# Patient Record
Sex: Female | Born: 1995 | Race: Black or African American | Hispanic: No | Marital: Single | Smoking: Never smoker
Health system: Southern US, Community
[De-identification: ages and names within clinical notes are randomized; demographics above are authoritative.]

## PROBLEM LIST (undated history)

## (undated) DIAGNOSIS — E039 Hypothyroidism, unspecified: Secondary | ICD-10-CM

## (undated) DIAGNOSIS — K5090 Crohn's disease, unspecified, without complications: Secondary | ICD-10-CM

## (undated) DIAGNOSIS — Z309 Encounter for contraceptive management, unspecified: Secondary | ICD-10-CM

## (undated) HISTORY — DX: Hypothyroidism, unspecified: E03.9

## (undated) HISTORY — DX: Crohn's disease, unspecified, without complications: K50.90

## (undated) HISTORY — DX: Encounter for contraceptive management, unspecified: Z30.9

---

## 2019-07-26 ENCOUNTER — Ambulatory Visit: Payer: Self-pay | Admitting: Nurse Practitioner

## 2019-08-14 ENCOUNTER — Encounter: Payer: Self-pay | Admitting: Nurse Practitioner

## 2019-08-14 ENCOUNTER — Ambulatory Visit (INDEPENDENT_AMBULATORY_CARE_PROVIDER_SITE_OTHER): Payer: Managed Care, Other (non HMO) | Admitting: Nurse Practitioner

## 2019-08-14 ENCOUNTER — Other Ambulatory Visit: Payer: Self-pay

## 2019-08-14 VITALS — BP 98/64 | HR 82 | Temp 98.1°F | Ht 65.8 in | Wt 105.4 lb

## 2019-08-14 DIAGNOSIS — R946 Abnormal results of thyroid function studies: Secondary | ICD-10-CM

## 2019-08-14 DIAGNOSIS — R61 Generalized hyperhidrosis: Secondary | ICD-10-CM

## 2019-08-14 DIAGNOSIS — E079 Disorder of thyroid, unspecified: Secondary | ICD-10-CM | POA: Diagnosis not present

## 2019-08-14 NOTE — Progress Notes (Signed)
This visit occurred during the SARS-CoV-2 public health emergency.  Safety protocols were in place, including screening questions prior to the visit, additional usage of staff PPE, and extensive cleaning of exam room while observing appropriate contact time as indicated for disinfecting solutions.  Subjective:     Patient ID: Gina Ware , female    DOB: 04/06/1996 , 24 y.o.   MRN: [id number]   Chief Complaint  Patient presents with  . Establish Care  . Thyroid problems    HPI  Here to establish care - she was going to a primary care in [place] - she is unable to remember who she was seeing. She moved here for one year for new opportunities. She works as a Engineer, maintenance (IT).  Single. No children.  Her mother found this office for her. She went to her gynecologist and there were concerns she had a problem with her thyroid based off her urine.  She went to get her birth control - she has nexplanon.    PMH - none  [place] - mother - none, father - healthy.    She gets hot at night and will sweat.     No past medical history on file.   Family History  Problem Relation Age of Onset  . Healthy Mother   . Healthy Father   . Diabetes Paternal Grandfather   . Hypertension Paternal Grandfather      Current Outpatient Medications:  .  etonogestrel (NEXPLANON) 21 MG IMPL implant, 1 each by Subdermal route once., Disp: , Rfl:    No Known Allergies   Review of Systems  Constitutional: Negative.  Negative for fatigue.  Respiratory: Negative.   Cardiovascular: Negative.  Negative for chest pain, palpitations and leg swelling.  Endocrine: Negative for cold intolerance, heat intolerance, polydipsia, polyphagia and polyuria.       Will feel hot at night alot  Musculoskeletal: Negative.   Skin: Negative.   Neurological: Negative for dizziness and headaches.  Psychiatric/Behavioral: Negative.      Today's Vitals   08/14/19 1200  BP: 98/64  Pulse: 82  Temp: 98.1 F (36.7 C)   TempSrc: Oral  Weight: 105 lb 6.4 oz (47.8 kg)  Height: 5' 5.8" (1.671 m)   Body mass index is 17.12 kg/m.   Objective:  Physical Exam Vitals reviewed.  Constitutional:      Appearance: Normal appearance.  Neck:     Comments: Neck around thyroid appears full Cardiovascular:     Rate and Rhythm: Normal rate and regular rhythm.     Pulses: Normal pulses.     Heart sounds: Normal heart sounds. No murmur.  Skin:    Capillary Refill: Capillary refill takes less than 2 seconds.  Neurological:     General: No focal deficit present.     Mental Status: She is alert and oriented to person, place, and time.     Cranial Nerves: No cranial nerve deficit.  Psychiatric:        Mood and Affect: Mood normal.        Behavior: Behavior normal.        Thought Content: Thought content normal.        Judgment: Judgment normal.         Assessment And Plan:     1. Night sweats  Will check for metabolic causes - TSH - CBC no Diff - BMP8+eGFR - QuantiFERON-TB Gold Plus - Ambulatory referral to Endocrinology  2. Swelling of thyroid gland  Neck is full  Will check  thyroid studies   Pending results will refer to endocrinology  Will order thyroid ultrasound as well - US THYROID; Future - Ambulatory referral to Endocrinology  3. Abnormal results of thyroid function studies  Levels are overproducing will refer to endocrinology - Ambulatory referral to Endocrinology  Minette Brine, [name]    THE PATIENT IS ENCOURAGED TO PRACTICE SOCIAL DISTANCING DUE TO THE COVID-19 PANDEMIC.

## 2019-08-16 LAB — BMP8+EGFR
BUN/Creatinine Ratio: 14 (ref 9–23)
BUN: 9 mg/dL (ref 6–20)
CO2: 21 mmol/L (ref 20–29)
Calcium: 8.7 mg/dL (ref 8.7–10.2)
Chloride: 110 mmol/L — ABNORMAL HIGH (ref 96–106)
Creatinine, Ser: 0.65 mg/dL (ref 0.57–1.00)
GFR calc Af Amer: 147 mL/min/{1.73_m2} (ref >59)
GFR calc non Af Amer: 127 mL/min/{1.73_m2} (ref >59)
Glucose: 74 mg/dL (ref 65–99)
Potassium: 4 mmol/L (ref 3.5–5.2)
Sodium: 142 mmol/L (ref 134–144)

## 2019-08-16 LAB — QUANTIFERON-TB GOLD PLUS
QuantiFERON Mitogen Value: >10 IU/mL
QuantiFERON Nil Value: 0.06 IU/mL
QuantiFERON TB1 Ag Value: 0.16 IU/mL
QuantiFERON TB2 Ag Value: 0.1 IU/mL
QuantiFERON-TB Gold Plus: NEGATIVE

## 2019-08-16 LAB — CBC
Hematocrit: 36.6 % (ref 34.0–46.6)
Hemoglobin: 12.2 g/dL (ref 11.1–15.9)
MCH: 31 pg (ref 26.6–33.0)
MCHC: 33.3 g/dL (ref 31.5–35.7)
MCV: 93 fL (ref 79–97)
Platelets: 245 10*3/uL (ref 150–450)
RBC: 3.94 x10E6/uL (ref 3.77–5.28)
RDW: 11.7 % (ref 11.7–15.4)
WBC: 5.6 10*3/uL (ref 3.4–10.8)

## 2019-08-16 LAB — TSH: TSH: 0.36 u[IU]/mL — ABNORMAL LOW (ref 0.450–4.500)

## 2019-09-25 ENCOUNTER — Other Ambulatory Visit: Payer: Managed Care, Other (non HMO)

## 2019-09-26 ENCOUNTER — Other Ambulatory Visit: Payer: Self-pay

## 2019-09-28 ENCOUNTER — Ambulatory Visit (INDEPENDENT_AMBULATORY_CARE_PROVIDER_SITE_OTHER): Payer: 59 | Admitting: Endocrinology

## 2019-09-28 ENCOUNTER — Ambulatory Visit
Admission: RE | Admit: 2019-09-28 | Discharge: 2019-09-28 | Disposition: A | Discharge status: Home / Self Care | Payer: 59 | Source: Ambulatory Visit | Attending: Nurse Practitioner | Admitting: Nurse Practitioner

## 2019-09-28 ENCOUNTER — Other Ambulatory Visit: Payer: Self-pay

## 2019-09-28 ENCOUNTER — Encounter: Payer: Self-pay | Admitting: Endocrinology

## 2019-09-28 DIAGNOSIS — E0590 Thyrotoxicosis, unspecified without thyrotoxic crisis or storm: Secondary | ICD-10-CM | POA: Insufficient documentation

## 2019-09-28 DIAGNOSIS — E079 Disorder of thyroid, unspecified: Secondary | ICD-10-CM

## 2019-09-28 LAB — T4, FREE: Free T4: 0.74 ng/dL (ref 0.60–1.60)

## 2019-09-28 LAB — TSH: TSH: 0.19 u[IU]/mL — ABNORMAL LOW (ref 0.35–4.50)

## 2019-09-28 NOTE — Patient Instructions (Addendum)
Let's recheck the blood test.  We'll let you know about the results of this, and the ultrasound.  Even if today's blood test is normal, we should still keep an eye on this, as it can become abnormal again Please come back for a follow-up appointment in 2-3 months.

## 2019-09-28 NOTE — Progress Notes (Signed)
Subjective:    Patient ID: Gina Ware, female    DOB: Jul 01, 1995, 24 y.o.   MRN: [id number]  HPI Pt is referred by Arnette Felts, NP, for hyperthyroidism.  Pt reports she was dx'ed with hyperthyroidism in early 2021.  she has never been on therapy for this.  she has never had XRT to the anterior neck, or thyroid surgery.  she does not consume kelp or any other non-prescribed thyroid medication.  she has never been on amiodarone.  She has night sweats.  No past medical history on file.  No past surgical history on file.  Social History   Socioeconomic History  . Marital status: Single    Spouse name: Not on file  . Number of children: Not on file  . Years of education: Not on file  . Highest education level: Not on file  Occupational History  . Not on file  Tobacco Use  . Smoking status: Never Smoker  . Smokeless tobacco: Never Used  Substance and Sexual Activity  . Alcohol use: Never  . Drug use: Never  . Sexual activity: Not on file  Other Topics Concern  . Not on file  Social History Narrative  . Not on file   Social Determinants of Health   Financial Resource Strain:   . Difficulty of Paying Living Expenses:   Food Insecurity:   . Worried About Programme researcher, broadcasting/film/video in the Last Year:   . Barista in the Last Year:   Transportation Needs:   . Freight forwarder (Medical):   Marland Kitchen Lack of Transportation (Non-Medical):   Physical Activity:   . Days of Exercise per Week:   . Minutes of Exercise per Session:   Stress:   . Feeling of Stress :   Social Connections:   . Frequency of Communication with Friends and Family:   . Frequency of Social Gatherings with Friends and Family:   . Attends Religious Services:   . Active Member of Clubs or Organizations:   . Attends Banker Meetings:   Marland Kitchen Marital Status:   Intimate Partner Violence:   . Fear of Current or Ex-Partner:   . Emotionally Abused:   Marland Kitchen Physically Abused:   . Sexually Abused:      Current Outpatient Medications on File Prior to Visit  Medication Sig Dispense Refill  . etonogestrel (NEXPLANON) 68 MG IMPL implant 1 each by Subdermal route once.     No current facility-administered medications on file prior to visit.    No Known Allergies  Family History  Problem Relation Age of Onset  . Healthy Mother   . Healthy Father   . Diabetes Paternal Grandfather   . Hypertension Paternal Grandfather   . Thyroid disease Neg Hx     BP 110/70   Pulse 74   Ht 5\' 5"  (1.651 m)   Wt 104 lb (47.2 kg)   SpO2 99%   BMI 17.31 kg/m   Review of Systems denies weight loss, palpitations, sob, muscle weakness, tremor, and heat intolerance.  She has anxiety.     Objective:   Physical Exam VS: see vs page GEN: no distress HEAD: head: no deformity eyes: there is bilat proptosis external nose and ears are normal NECK: supple, thyroid is not enlarged CHEST WALL: no deformity LUNGS: clear to auscultation CV: reg rate and rhythm, no murmur.  MUSCULOSKELETAL: muscle bulk and strength are grossly normal.  no obvious joint swelling.  gait is normal and steady.  EXTEMITIES: no deformity.  no edema PULSES: no carotid bruit NEURO:  cn 2-12 grossly intact.   readily moves all 4's.  sensation is intact to touch on all 4's SKIN:  Normal texture and temperature.  No rash or suspicious lesion is visible.   NODES:  None palpable at the neck PSYCH: alert, well-oriented.  Does not appear anxious nor depressed.    Lab Results  Component Value Date   CREATININE 0.65 08/14/2019   BUN 9 08/14/2019   NA 142 08/14/2019   K 4.0 08/14/2019   CL 110 (H) 08/14/2019   CO2 21 08/14/2019   Lab Results  Component Value Date   TSH 0.19 (L) 09/28/2019   [place]: Mildly heterogeneous and slightly atrophic thyroid gland without discrete nodule or mass. Findings are nonspecific though could be seen in the setting of thyroiditis.  I have reviewed outside records, and summarized: Pt was noted to  have low TSH, and referred here.  She was seen as a new pt.  Main symptom was night sweats.        Assessment & Plan:  Hyperthyroidism, new Proptosis.  This suggests Grave's Dz as a cause of the above.    Patient Instructions  Let's recheck the blood test.  We'll let you know about the results of this, and the ultrasound.  Even if today's blood test is normal, we should still keep an eye on this, as it can become abnormal again Please come back for a follow-up appointment in 2-3 months.

## 2019-10-08 ENCOUNTER — Other Ambulatory Visit: Payer: Self-pay

## 2019-10-08 ENCOUNTER — Encounter: Payer: Self-pay | Admitting: Nurse Practitioner

## 2019-10-08 ENCOUNTER — Ambulatory Visit: Payer: 59 | Admitting: Nurse Practitioner

## 2019-10-08 VITALS — BP 110/78 | HR 82 | Temp 98.2°F | Ht 65.4 in | Wt 103.2 lb

## 2019-10-08 DIAGNOSIS — R61 Generalized hyperhidrosis: Secondary | ICD-10-CM | POA: Diagnosis not present

## 2019-10-08 DIAGNOSIS — E0590 Thyrotoxicosis, unspecified without thyrotoxic crisis or storm: Secondary | ICD-10-CM

## 2019-10-08 NOTE — Patient Instructions (Signed)

## 2019-10-08 NOTE — Progress Notes (Signed)
  This visit occurred during the SARS-CoV-2 public health emergency.  Safety protocols were in place, including screening questions prior to the visit, additional usage of staff PPE, and extensive cleaning of exam room while observing appropriate contact time as indicated for disinfecting solutions.  Subjective:     Patient ID: Gina Ware , female    DOB: 02-01-1996 , 24 y.o.   MRN: [id number]   Chief Complaint  Patient presents with  . Night Sweats    patient stated has not been having sweats as often as she was     HPI  Her hot flashes are not as consistent.  Last night she had feelings. She has seen Dr. Everardo All who has diagnosed her with mild Graves Disease.  She is to follow up with him in 3 months but she is now wanting medications.    She is going to [facility] for her PAP she is scheduled for Wednesday.  Thyroid Problem Presents for follow-up visit. Patient reports no palpitations, weight gain or weight loss. (Night sweats) The symptoms have been improving.     No past medical history on file.   Family History  Problem Relation Age of Onset  . Healthy Mother   . Healthy Father   . Diabetes Paternal Grandfather   . Hypertension Paternal Grandfather   . Thyroid disease Neg Hx      Current Outpatient Medications:  .  etonogestrel (NEXPLANON) 68 MG IMPL implant, 1 each by Subdermal route once., Disp: , Rfl:    No Known Allergies   Review of Systems  Constitutional: Negative.  Negative for fever, weight gain and weight loss.  Respiratory: Negative.   Cardiovascular: Negative.  Negative for chest pain, palpitations and leg swelling.  Endocrine: Negative for polydipsia, polyphagia and polyuria.  Neurological: Negative for dizziness and headaches.  Psychiatric/Behavioral: Negative.      Today's Vitals   10/08/19 1410  BP: 110/78  Pulse: 82  Temp: 98.2 F (36.8 C)  TempSrc: Oral  Weight: 103 lb 3.2 oz (46.8 kg)  Height: 5' 5.4" (1.661 m)  PainSc: 0-No pain    Body mass index is 16.96 kg/m.   Objective:  Physical Exam Vitals reviewed.  Constitutional:      Appearance: Normal appearance.  Cardiovascular:     Rate and Rhythm: Normal rate and regular rhythm.     Pulses: Normal pulses.     Heart sounds: Normal heart sounds. No murmur.  Pulmonary:     Effort: Pulmonary effort is normal. No respiratory distress.     Breath sounds: Normal breath sounds.  Skin:    Capillary Refill: Capillary refill takes less than 2 seconds.  Neurological:     General: No focal deficit present.     Mental Status: She is alert and oriented to person, place, and time.  Psychiatric:        Mood and Affect: Mood normal.        Behavior: Behavior normal.        Thought Content: Thought content normal.        Judgment: Judgment normal.         Assessment And Plan:     1. Hyperthyroidism  She has been seen by Dr. Everardo All, I have advised her to call Dr. Everardo All to start medications.  2. Night sweats  Has slightly improved   Arnette Felts, [name]    THE PATIENT IS ENCOURAGED TO PRACTICE SOCIAL DISTANCING DUE TO THE COVID-19 PANDEMIC.

## 2019-10-09 ENCOUNTER — Ambulatory Visit: Payer: Managed Care, Other (non HMO) | Admitting: Nurse Practitioner

## 2019-10-09 ENCOUNTER — Encounter: Payer: Self-pay | Admitting: Nurse Practitioner

## 2019-10-30 ENCOUNTER — Encounter: Payer: Self-pay | Admitting: Endocrinology

## 2019-10-30 ENCOUNTER — Other Ambulatory Visit: Payer: Self-pay

## 2019-10-30 ENCOUNTER — Ambulatory Visit (INDEPENDENT_AMBULATORY_CARE_PROVIDER_SITE_OTHER): Payer: 59 | Admitting: Endocrinology

## 2019-10-30 VITALS — BP 98/70 | HR 76 | Ht 65.4 in | Wt 103.0 lb

## 2019-10-30 DIAGNOSIS — Z309 Encounter for contraceptive management, unspecified: Secondary | ICD-10-CM | POA: Insufficient documentation

## 2019-10-30 DIAGNOSIS — Z3040 Encounter for surveillance of contraceptives, unspecified: Secondary | ICD-10-CM | POA: Diagnosis not present

## 2019-10-30 DIAGNOSIS — E0590 Thyrotoxicosis, unspecified without thyrotoxic crisis or storm: Secondary | ICD-10-CM | POA: Diagnosis not present

## 2019-10-30 NOTE — Progress Notes (Signed)
   Subjective:    Patient ID: Gina Ware, female    DOB: 1996-06-11, 24 y.o.   MRN: [id number]  HPI  Pt returns for f/u of mild hyperthyroidism (dx'ed early 2021; she has never been on therapy for this; [place] is c/w thyroiditis).  pt states she feels well in general.   Past Medical History:  Diagnosis Date  . Contraception management     No past surgical history on file.  Social History   Socioeconomic History  . Marital status: Single    Spouse name: Not on file  . Number of children: Not on file  . Years of education: Not on file  . Highest education level: Not on file  Occupational History  . Not on file  Tobacco Use  . Smoking status: Never Smoker  . Smokeless tobacco: Never Used  Substance and Sexual Activity  . Alcohol use: Never  . Drug use: Never  . Sexual activity: Not on file  Other Topics Concern  . Not on file  Social History Narrative  . Not on file   Social Determinants of Health   Financial Resource Strain:   . Difficulty of Paying Living Expenses:   Food Insecurity:   . Worried About Programme researcher, broadcasting/film/video in the Last Year:   . Barista in the Last Year:   Transportation Needs:   . Freight forwarder (Medical):   Marland Kitchen Lack of Transportation (Non-Medical):   Physical Activity:   . Days of Exercise per Week:   . Minutes of Exercise per Session:   Stress:   . Feeling of Stress :   Social Connections:   . Frequency of Communication with Friends and Family:   . Frequency of Social Gatherings with Friends and Family:   . Attends Religious Services:   . Active Member of Clubs or Organizations:   . Attends Banker Meetings:   Marland Kitchen Marital Status:   Intimate Partner Violence:   . Fear of Current or Ex-Partner:   . Emotionally Abused:   Marland Kitchen Physically Abused:   . Sexually Abused:     Current Outpatient Medications on File Prior to Visit  Medication Sig Dispense Refill  . etonogestrel (NEXPLANON) 68 MG IMPL implant 1 each by  Subdermal route once.     No current facility-administered medications on file prior to visit.    No Known Allergies  Family History  Problem Relation Age of Onset  . Healthy Mother   . Healthy Father   . Diabetes Paternal Grandfather   . Hypertension Paternal Grandfather   . Thyroid disease Neg Hx     BP 98/70   Pulse 76   Ht 5' 5.4" (1.661 m)   Wt 103 lb (46.7 kg)   SpO2 98%   BMI 16.93 kg/m    Review of Systems Denies neck pain or swelling.     Objective:   Physical Exam VITAL SIGNS:  See vs page GENERAL: no distress NECK: thyroid is slightly enlarged--diffuse.  Lab Results  Component Value Date   TSH 0.28 (L) 10/30/2019       Assessment & Plan:  Hyperrthyroidism, mild Thyroiditis, per [place]: I told pt this is c/w mild Grave's Dz No medication is needed.  Please come back for a follow-up appointment in 4-6 months.

## 2019-10-30 NOTE — Patient Instructions (Addendum)
Blood tests are requested for you today.  We'll let you know about the results.   Even if today's blood test is normal, we should still keep an eye on this, as it can become abnormal again.  If it is more abnormal., you should take medication for it.

## 2019-10-31 ENCOUNTER — Encounter: Payer: Self-pay | Admitting: Endocrinology

## 2019-10-31 LAB — T4, FREE: Free T4: 0.98 ng/dL (ref 0.60–1.60)

## 2019-10-31 LAB — TSH: TSH: 0.28 u[IU]/mL — ABNORMAL LOW (ref 0.35–4.50)

## 2019-11-01 ENCOUNTER — Other Ambulatory Visit: Payer: Self-pay | Admitting: Endocrinology

## 2019-11-01 MED ORDER — METHIMAZOLE 5 MG PO TABS: 5.0000 mg | ORAL_TABLET | Freq: Three times a day (TID) | ORAL | 1 refills | Status: DC

## 2019-11-03 ENCOUNTER — Inpatient Hospital Stay
Admission: RE | Admit: 2019-11-03 | Discharge: 2019-11-03 | Disposition: A | Discharge status: Home / Self Care | Payer: 59 | Source: Ambulatory Visit

## 2019-11-03 ENCOUNTER — Ambulatory Visit (INDEPENDENT_AMBULATORY_CARE_PROVIDER_SITE_OTHER)
Admission: RE | Admit: 2019-11-03 | Discharge: 2019-11-03 | Disposition: A | Discharge status: Home / Self Care | Payer: 59 | Source: Ambulatory Visit

## 2019-11-03 DIAGNOSIS — J029 Acute pharyngitis, unspecified: Secondary | ICD-10-CM | POA: Diagnosis not present

## 2019-11-03 NOTE — ED Provider Notes (Signed)
Virtual Visit via Video Note:  Gina Ware  initiated request for Telemedicine visit with [facility] team. I connected with Gina Ware  on 11/03/2019 at 1245 PM  for a synchronized telemedicine visit using a video enabled HIPPA compliant telemedicine application. I verified that I am speaking with Gina Ware  using two identifiers. Durward Parcel, [name]  was physically located in a [facility] Urgent care site and Gina Ware was located at a different location.   The limitations of evaluation and management by telemedicine as well as the availability of in-person appointments were discussed. Patient was informed that she  may incur a bill ( including co-pay) for this virtual visit encounter. Gina Ware  expressed understanding and gave verbal consent to proceed with virtual visit.     History of Present Illness:Gina Ware  is a 24 y.o. female with history of hypothyroidism was recently diagnosed presented via telehealth with a complaint of sore and swollen throat.  Reports some difficulty swallowing.  Denies any exposure to flu, strep or Covid.  Patient reports  she is not currently taking any medication for hypothyroidism as PCP recommended no medication at this time.  Denies chills, fever, nausea vomiting, diarrhea.  Past Medical History:  Diagnosis Date  . Contraception management     No Known Allergies      Observations/Objective: VITALS: Per patient if applicable, see vitals. GENERAL: Alert, appears well and in no acute distress. CARDIOPULMONARY: No increased WOB. Speaking in clear sentences. I:E ratio WNL.  PSYCH: Pleasant and cooperative, well-groomed. Speech normal rate and rhythm. Affect is appropriate. Insight and judgement are appropriate. Attention is focused, linear, and appropriate.  NEURO: Oriented as arrived to appointment on time with no prompting. Moves both UE equally.      Assessment and Plan:     ICD-10-CM   1. Sore throat   J02.9     Follow Up Instructions: Was advised to go to the closest urgent care for further evaluation   I discussed the assessment and treatment plan with the patient. The patient was provided an opportunity to ask questions and all were answered. The patient agreed with the plan and demonstrated an understanding of the instructions.   The patient was advised to call back or seek an in-person evaluation if the symptoms worsen or if the condition fails to improve as anticipated.  I provided 15 minutes of non-face-to-face time during this encounter.    Durward Parcel, [name]  11/03/2019 12:56 PM         Durward Parcel, [name] 11/03/19 1257

## 2019-11-03 NOTE — Discharge Instructions (Addendum)
Patient was advised to go to the nearest urgent care for further evaluation

## 2019-12-31 ENCOUNTER — Ambulatory Visit: Payer: 59 | Admitting: Endocrinology

## 2019-12-31 DIAGNOSIS — Z0289 Encounter for other administrative examinations: Secondary | ICD-10-CM

## 2020-01-14 ENCOUNTER — Ambulatory Visit: Payer: 59 | Admitting: Family Medicine

## 2020-01-14 NOTE — Progress Notes (Deleted)
   Subjective:    Patient ID: Gina Ware, female    DOB: 1996-01-20, 24 y.o.   MRN: [id number]   CC: Establish care  HPI:  Gina Ware is a very pleasant 24 y.o. female who presents today to establish care.  Initial concerns:***  Past medical history: Hyperthyroidism***.  Past surgical history:***  Current medications:***  Family history:***  Social history:***  ROS: pertinent noted in the HPI   Objective:  There were no vitals taken for this visit.  Vitals and nursing note reviewed  General: NAD, pleasant, able to participate in exam Cardiac: RRR, S1 S2 present. normal heart sounds, no murmurs. Respiratory: CTAB, normal effort, No wheezes, rales or rhonchi Abdomen: Bowel sounds present, non-tender, non-distended, no hepatosplenomegaly Extremities: no edema or cyanosis. Skin: warm and dry, no rashes noted Neuro: alert, no obvious focal deficits Psych: Normal affect and mood   Assessment & Plan:    No problem-specific Assessment & Plan notes found for this encounter.  Hep C screening, HIV screening, Tdap  Jackelyn Poling, DO [facility] PGY-1

## 2020-04-08 ENCOUNTER — Encounter: Payer: 59 | Admitting: Nurse Practitioner

## 2020-12-17 ENCOUNTER — Other Ambulatory Visit: Payer: Self-pay

## 2020-12-17 ENCOUNTER — Encounter: Payer: Self-pay | Admitting: Nurse Practitioner

## 2020-12-17 ENCOUNTER — Telehealth (INDEPENDENT_AMBULATORY_CARE_PROVIDER_SITE_OTHER): Payer: Self-pay | Admitting: Nurse Practitioner

## 2020-12-17 ENCOUNTER — Other Ambulatory Visit: Payer: Self-pay | Admitting: Nurse Practitioner

## 2020-12-17 VITALS — Ht 64.0 in | Wt 110.0 lb

## 2020-12-17 DIAGNOSIS — J039 Acute tonsillitis, unspecified: Secondary | ICD-10-CM

## 2020-12-17 DIAGNOSIS — U071 COVID-19: Secondary | ICD-10-CM

## 2020-12-17 DIAGNOSIS — J351 Hypertrophy of tonsils: Secondary | ICD-10-CM

## 2020-12-17 LAB — POCT RAPID STREP A (OFFICE): Rapid Strep A Screen: NEGATIVE

## 2020-12-17 MED ORDER — AMOXICILLIN 875 MG PO TABS: 875.0000 mg | ORAL_TABLET | Freq: Two times a day (BID) | ORAL | 0 refills | Status: DC

## 2020-12-17 NOTE — Patient Instructions (Signed)
COVID-19: Quarantine and Isolation Quarantine If you were exposed Quarantine and stay away from others when you have been in close contact with someone whohas COVID-19. Isolate If you are sick or test positive Isolate when you are sick or when you have COVID-19, even if you don't have symptoms. When to stay home Calculating quarantine The date of your exposure is considered day 0. Day 1 is the first full day after your last contact with a person who has had COVID-19. Stay home and away from other people for at least 5 days. Learn why CDC updated guidance for the general public. IF YOU were exposed to COVID-19 and are NOT up-to-date IF YOU were exposed to COVID-19 and are NOT on COVID-19 vaccinations Quarantine for at least 5 days Stay home Stay home and quarantine for at least 5 full days. Wear a well-fitted mask if you must be around others in your home. Do not travel. Get tested Even if you don't develop symptoms, get tested at least 5 days after you last had close contact with someone with COVID-19. After quarantine Watch for symptoms Watch for symptoms until 10 days after you last had close contact with someone with COVID-19. Avoid travel It is best to avoid travel until a full 10 days after you last had close contact with someone with COVID-19. If you develop symptoms Isolate immediately and get tested. Continue to stay home until you know the results. Wear a well-fitted mask around others. Take precautions until day 10 Wear a mask Wear a well-fitted mask for 10 full days any time you are around others inside your home or in public. Do not go to places where you are unable to wear a mask. If you must travel during days 6-10, take precautions. Avoid being around people who are at high risk IF YOU were exposed to COVID-19 and are up-to-date IF YOU were exposed to COVID-19 and are on COVID-19 vaccinations No quarantine You do not need to stay home unless you develop  symptoms. Get tested Even if you don't develop symptoms, get tested at least 5 days after you last had close contact with someone with COVID-19. Watch for symptoms Watch for symptoms until 10 days after you last had close contact with someone with COVID-19. If you develop symptoms Isolate immediately and get tested. Continue to stay home until you know the results. Wear a well-fitted mask around others. Take precautions until day 10 Wear a mask Wear a well-fitted mask for 10 full days any time you are around others inside your home or in public. Do not go to places where you are unable to wear a mask. Take precautions if traveling Avoid being around people who are at high risk IF YOU were exposed to COVID-19 and had confirmed COVID-19 within the past 90 days (you tested positive using a viral test) No quarantine You do not need to stay home unless you develop symptoms. Watch for symptoms Watch for symptoms until 10 days after you last had close contact with someone with COVID-19. If you develop symptoms Isolate immediately and get tested. Continue to stay home until you know the results. Wear a well-fitted mask around others. Take precautions until day 10 Wear a mask Wear a well-fitted mask for 10 full days any time you are around others inside your home or in public. Do not go to places where you are unable to wear a mask. Take precautions if traveling Avoid being around people who are at high risk Calculating isolation   Day 0 is your first day of symptoms or a positive viral test. Day 1 is the first full day after your symptoms developed or your test specimen was collected. If you have COVID-19 or have symptoms, isolate for at least 5 days. IF YOU tested positive for COVID-19 or have symptoms, regardless of vaccination status Stay home for at least 5 days Stay home for 5 days and isolate from others in your home. Wear a well-fitted mask if you must be around others in your home. Do not  travel. Ending isolation if you had symptoms End isolation after 5 full days if you are fever-free for 24 hours (without the use of fever-reducing medication) and your symptoms are improving. Ending isolation if you did NOT have symptoms End isolation after at least 5 full days after your positive test. If you were severely ill with COVID-19 or are immunocompromised You should isolate for at least 10 days. Consult your doctor before ending isolation. Take precautions until day 10 Wear a mask Wear a well-fitted mask for 10 full days any time you are around others inside your home or in public. Do not go to places where you are unable to wear a mask. Do not travel Do not travel until a full 10 days after your symptoms started or the date your positive test was taken if you had no symptoms. Avoid being around people who are at high risk Definitions Exposure Contact with someone infected with SARS-CoV-2, the virus that causes COVID-19,in a way that increases the likelihood of getting infected with the virus. Close contact A close contact is someone who was less than 6 feet away from an infected person (laboratory-confirmed or a clinical diagnosis) for a cumulative total of 15 minutes or more over a 24-hour period. For example, three individual 5-minute exposures for a total of 15 minutes. People who are exposed to someone with COVID-19 after they completed at least 5 days of isolation are notconsidered close contacts. Quarantine Quarantine is a strategy used to prevent transmission of COVID-19 by keeping people who have been in close contact with someone with COVID-19 apart from others. Who does not need to quarantine? If you had close contact with someone with COVID-19 and you are in one of the following groups, you do not need to quarantine. You are up to date with your COVID-19 vaccines. You had confirmed COVID-19 within the last 90 days (meaning you tested positive using a viral test). You  should wear a well-fitting mask around others for 10 days from the date of your last close contact with someone with COVID-19 (the date of last close contact is considered day 0). Get tested at least 5 days after you last had close contact with someone with COVID-19. If you test positive or develop COVID-19 symptoms, isolate from other people and follow recommendations in the Isolation section below. If you tested positive for COVID-19 with a viral test within the previous 90 days and subsequently recovered and remain without COVID-19 symptoms, you do not need to quarantine or get tested after close contact. You should wear a well-fitting mask around others for 10 days from the date of your last close contact withsomeone with COVID-19 (the date of last close contact is considered day 0). Who should quarantine? If you come into close contact with someone with COVID-19, you should quarantine if you are not up to date on COVID-19 vaccines. This includes people who are not vaccinated. What to do for quarantine Stay home and away   from other people for at least 5 days (day 0 through day 5) after your last contact with a person who has COVID-19. The date of your exposure is considered day 0. Wear a well-fitting mask when around others at home, if possible. For 10 days after your last close contact with someone with COVID-19, watch for fever (100.4F or greater), cough, shortness of breath, or other COVID-19 symptoms. If you develop symptoms, get tested immediately and isolate until you receive your test results. If you test positive, follow isolation recommendations. If you do not develop symptoms, get tested at least 5 days after you last had close contact with someone with COVID-19. If you test negative, you can leave your home, but continue to wear a well-fitting mask when around others at home and in public until 10 days after your last close contact with someone with COVID-19. If you test positive, you should  isolate for at least 5 days from the date of your positive test (if you do not have symptoms). If you do develop COVID-19 symptoms, isolate for at least 5 days from the date your symptoms began (the date the symptoms started is day 0). Follow recommendations in the isolation section below. If you are unable to get a test 5 days after last close contact with someone with COVID-19, you can leave your home after day 5 if you have been without COVID-19 symptoms throughout the 5-day period. Wear a well-fitting mask for 10 days after your date of last close contact when around others at home and in public. Avoid people who are immunocompromised or at high risk for severe disease, and nursing homes and other high-risk settings, until after at least 10 days. If possible, stay away from people you live with, especially people who are at higher risk for getting very sick from COVID-19, as well as others outside your home throughout the full 10 days after your last close contact with someone with COVID-19. If you are unable to quarantine, you should wear a well-fitting mask for 10 days when around others at home and in public. If you are unable to wear a mask when around others, you should continue to quarantine for 10 days. Avoid people who are immunocompromised or at high risk for severe disease, and nursing homes and other high-risk settings, until after at least 10 days. See additional information about travel. Do not go to places where you are unable to wear a mask, such as restaurants and some gyms, and avoid eating around others at home and at work until after 10 days after your last close contact with someone with COVID-19. After quarantine Watch for symptoms until 10 days after your last close contact with someone with COVID-19. If you have symptoms, isolate immediately and get tested. Quarantine in high-risk congregate settings In certain congregate settings that have high risk of secondary transmission  (such as correctional and detention facilities, homeless shelters, or cruise ships), CDC recommends a 10-day quarantine for residents, regardless of vaccination and booster status. During periods of critical staffing shortages, facilities may consider shortening the quarantine period for staff to ensure continuity of operations. Decisions to shorten quarantine in these settings should be made in consultation with state, local, tribal, or territorial health departments and should take into consideration the context and characteristics of the facility. CDC's setting-specific guidance provides additional recommendations for these settings. Isolation Isolation is used to separate people with confirmed or suspected COVID-19 from those without COVID-19. People who are in isolation should stay   home until it's safe for them to be around others. At home, anyone sick or infected should separate from others, or wear a well-fitting mask when they need to be around others. People in isolation should stay in a specific "sick room" or area and use a separate bathroom if available. Everyone who has presumed or confirmed COVID-19 should stay home and isolate from other people for at least 5 full days (day 0 is the first day of symptoms or the date of the day of the positive viral test for asymptomatic persons). They should wear a mask when around others at home and in public for an additional 5 days. People who are confirmed to have COVID-19 or are showing symptoms of COVID-19 need to isolate regardless of their vaccination status. This includes: People who have a positive viral test for COVID-19, regardless of whether or not they have symptoms. People with symptoms of COVID-19, including people who are awaiting test results or have not been tested. People with symptoms should isolate even if they do not know if they have been in close contact with someone with COVID-19. What to do for isolation Monitor your symptoms. If you  have an emergency warning sign (including trouble breathing), seek emergency medical care immediately. Stay in a separate room from other household members, if possible. Use a separate bathroom, if possible. Take steps to improve ventilation at home, if possible. Avoid contact with other members of the household and pets. Don't share personal household items, like cups, towels, and utensils. Wear a well-fitting mask when you need to be around other people. Learn more about what to do if you are sick and how to notify your contacts. Ending isolation for people who had COVID-19 and had symptoms If you had COVID-19 and had symptoms, isolate for at least 5 days. To calculate your 5-day isolation period, day 0 is your first day of symptoms. Day 1 is the first full day after your symptoms developed. You can leave isolation after 5 full days. You can end isolation after 5 full days if you are fever-free for 24 hours without the use of fever-reducing medication and your other symptoms have improved (Loss of taste and smell may persist for weeks or months after recovery and need not delay the end of isolation). You should continue to wear a well-fitting mask around others at home and in public for 5 additional days (day 6 through day 10) after the end of your 5-day isolation period. If you are unable to wear a mask when around others, you should continue to isolate for a full 10 days. Avoid people who are immunocompromised or at high risk for severe disease, and nursing homes and other high-risk settings, until after at least 10 days. If you continue to have fever or your other symptoms have not improved after 5 days of isolation, you should wait to end your isolation until you are fever-free for 24 hours without the use of fever-reducing medication and your other symptoms have improved. Continue to wear a well-fitting mask. Contact your healthcare provider if you have questions. See additional information about  travel. Do not go to places where you are unable to wear a mask, such as restaurants and some gyms, and avoid eating around others at home and at work until a full 10 days after your first day of symptoms. If an individual has access to a test and wants to test, the best approach is to use an antigen test1 towards the end of   the 5-day isolation period. Collect the test sample only if you are fever-free for 24 hours without the use of fever-reducing medication and your other symptoms have improved (loss of taste and smell may persist for weeks or months after recovery and need not delay the end of isolation). If your test result is positive, you should continue to isolate until day 10. If your test result is negative, you can end isolation, but continue to wear a well-fitting mask around others at home and in public until day 10. Follow additional recommendations for masking and avoiding travel as described above. 1As noted in the labeling for authorized over-the counter antigen tests: Negative results should be treated as presumptive. Negative results do not rule out SARS-CoV-2 infection and should not be used as the sole basis for treatment or patient management decisions, including infection control decisions. To improve results, antigen tests should be used twice over a three-day period with at least 24 hours and no more than 48 hours between tests. Note that these recommendations on ending isolation do not apply to people with moderate or severe COVID-19 or with weakened immune systems (immunocompromised). See section below for recommendations for when toend isolation for these groups. Ending isolation for people who tested positive for COVID-19 but had no symptoms If you test positive for COVID-19 and never develop symptoms, isolate for at least 5 days. Day 0 is the day of your positive viral test (based on the date you were tested) and day 1 is the first full day after the specimen was collected for your  positive test. You can leave isolation after 5 full days. If you continue to have no symptoms, you can end isolation after at least 5 days. You should continue to wear a well-fitting mask around others at home and in public until day 10 (day 6 through day 10). If you are unable to wear a mask when around others, you should continue to isolate for 10 days. Avoid people who are immunocompromised or at high risk for severe disease, and nursing homes and other high-risk settings, until after at least 10 days. If you develop symptoms after testing positive, your 5-day isolation period should start over. Day 0 is your first day of symptoms. Follow the recommendations above for ending isolation for people who had COVID-19 and had symptoms. See additional information about travel. Do not go to places where you are unable to wear a mask, such as restaurants and some gyms, and avoid eating around others at home and at work until 10 days after the day of your positive test. If an individual has access to a test and wants to test, the best approach is to use an antigen test1 towards the end of the 5-day isolation period. If your test result is positive, you should continue to isolate until day 10. If your test result is negative, you can end isolation, but continue to wear a well-fitting mask around others at home and in public until day 10. Follow additionalrecommendations for masking and avoiding travel as described above. 1As noted in the labeling for authorized over-the counter antigen tests: Negative results should be treated as presumptive. Negative results do not rule out SARS-CoV-2 infection and should not be used as the sole basis for treatment or patient management decisions, including infection control decisions. To improve results, antigen tests should be used twice over a three-day period with at least 24 hours and no more than 48 hours between tests. Ending isolation for people who were   severely ill with  COVID-19 or have a weakened immune system (immunocompromised) People who are severely ill with COVID-19 (including those who were hospitalized or required intensive care or ventilation support) and people with compromised immune systems might need to isolate at home longer. They may also require testing with a viral test to determine when they can be around others. CDC recommends an isolation period of at least 10 and up to 20 days for people who were severely ill with COVID-19 and for people with weakened immune systems. Consult with your healthcare provider about when you can resume being aroundother people. People who are immunocompromised should talk to their healthcare provider about the potential for reduced immune responses to COVID-19 vaccines and the need to continue to follow current prevention measures (including wearing a well-fitting mask, staying 6 feet apart from others they don't live with, and avoiding crowds and poorly ventilated indoor spaces) to protect themselves against COVID-19 until advised otherwise by their healthcare provider. Close contacts of immunocompromised people--including household members--should also be encouraged to receive all recommended COVID-19 vaccine doses to help protect these people. Isolation in high-risk congregate settings In certain high-risk congregate settings that have high risk of secondary transmission and where it is not feasible to cohort people (such as correctional and detention facilities, homeless shelters, and cruise ships), CDC recommends a 10-day isolation period for residents. During periods of critical staffing shortages, facilities may consider shortening the isolation period for staff to ensure continuity of operations. Decisions to shorten isolation in these settings should be made in consultation with state, local, tribal, or territorial health departments and should take into consideration the context and characteristics of the facility.  CDC's setting-specific guidance provides additional recommendations for these settings. This CDC guidance is meant to supplement--not replace--any federal, state, local,territorial, or tribal health and safety laws, rules, and regulations. Recommendations for specific settings These recommendations do not apply to healthcare professionals. For guidance specific to these settings, see Healthcare professionals: Interim Guidance for Managing Healthcare Personnel with SARS-CoV-2 Infection or Exposure to SARS-CoV-2 Patients, residents, and visitors to healthcare settings: Interim Infection Prevention and Control Recommendations for Healthcare Personnel During the Coronavirus Disease 2019 (COVID-19) Pandemic Additional setting-specific guidance and recommendations are available. These recommendations on quarantine and isolation do apply to K-12 School settings. Additional guidance is available here: Overview of COVID-19 Quarantine for K-12 Schools Travelers: Travel information and recommendations Congregate facilities and other settings: guidance pages for community, work, and school settings Ongoing COVID-19 exposure FAQs I live with someone with COVID-19, but I cannot be separated from them. How do we manage quarantine in this situation? It is very important for people with COVID-19 to remain apart from other people, if possible, even if they are living together. If separation of the person with COVID-19 from others that they live with is not possible, the other people that they live with will have ongoing exposure, meaning they will be repeatedly exposed until that person is no longer able to spread the virus to other people. In this situation, there are precautions you can take to limit the spread of COVID-19: The person with COVID-19 and everyone they live with should wear a well-fitting mask inside the home. If possible, one person should care for the person with COVID-19 to limit the number of people  who are in close contact with the infected person. Take steps to protect yourself and others to reduce transmission in the home: Quarantine if you are not up to date with your COVID-19   vaccines. Isolate if you are sick or tested positive for COVID-19, even if you don't have symptoms. Learn more about the public health recommendations for testing, mask use and quarantine of close contacts, like yourself, who have ongoing exposure. These recommendations differ depending on your vaccination status. What should I do if I have ongoing exposure to COVID-19 from someone I live with? Recommendations for this situation depend on your vaccination status: If you are not up to date on COVID-19 vaccines and have ongoing exposure to COVID-19, you should: Begin quarantine immediately and continue to quarantine throughout the isolation period of the person with COVID-19. Continue to quarantine for an additional 5 days starting the day after the end of isolation for the person with COVID-19. Get tested at least 5 days after the end of isolation of the infected person that lives with them. If you test negative, you can leave the home but should continue to wear a well-fitting mask when around others at home and in public until 10 days after the end of isolation for the person with COVID-19. Isolate immediately if you develop symptoms of COVID-19 or test positive. If you are up to date with COVID-19 vaccines and have ongoing exposure to COVID-19, you should: Get tested at least 5 days after your first exposure. A person with COVID-19 is considered infectious starting 2 days before they develop symptoms, or 2 days before the date of their positive test if they do not have symptoms. Get tested again at least 5 days after the end of isolation for the person with COVID-19. Wear a well-fitting mask when you are around the person with COVID-19, and do this throughout their isolation period. Wear a well-fitting mask around  others for 10 days after the infected person's isolation period ends. Isolate immediately if you develop symptoms of COVID-19 or test positive. What should I do if multiple people I live with test positive for COVID-19 at different times? Recommendations for this situation depend on your vaccination status: If you are not up to date with your COVID-19 vaccines, you should: Quarantine throughout the isolation period of any infected person that you live with. Continue to quarantine until 5 days after the end of isolation date for the most recently infected person that lives with you. For example, if the last day of isolation of the person most recently infected with COVID-19 was June 30, the new 5-day quarantine period starts on July 1. Get tested at least 5 days after the end of isolation for the most recently infected person that lives with you. Wear a well-fitting mask when you are around any person with COVID-19 while that person is in isolation. Wear a well-fitting mask when you are around other people until 10 days after your last close contact. Isolate immediately if you develop symptoms of COVID-19 or test positive. If you are up to date with COVID-19 your vaccines , you should: Get tested at least 5 days after your first exposure. A person with COVID-19 is considered infectious starting 2 days before they developed symptoms, or 2 days before the date of their positive test if they do not have symptoms. Get tested again at least 5 days after the end of isolation for the most recently infected person that lives with you. Wear a well-fitting mask when you are around any person with COVID-19 while that person is in isolation. Wear a well-fitting mask around others for 10 days after the end of isolation for the most recently infected person that   lives with you. For example, if the last day of isolation for the person most recently infected with COVID-19 was June 30, the new 10-day period to wear a  well-fitting mask indoors in public starts on July 1. Isolate immediately if you develop symptoms of COVID-19 or test positive. I had COVID-19 and completed isolation. Do I have to quarantine or get tested if someone I live with gets COVID-19 shortly after I completed isolation? No. If you recently completed isolation and someone that lives with you tests positive for the virus that causes COVID-19 shortly after the end of your isolation period, you do not have to quarantine or get tested as long as you do not develop new symptoms. Once all of the people that live together have completed isolation or quarantine, refer to the guidance below for new exposures to COVID-19. If you had COVID-19 in the previous 90 days and then came into close contact with someone with COVID-19, you do not have to quarantine or get tested if you do not have symptoms. But you should: Wear a well-fitting mask indoors in public for 10 days after exposure. Monitor for COVID-19 symptoms and isolate immediately if symptoms develop. Consult with a healthcare provider for testing recommendations if new symptoms develop. If more than 90 days have passed since your recovery from infection, follow CDC's recommendations for close contacts. These recommendations will differ depending on your vaccination status. 07/10/2020 Content source: National Center for Immunization and Respiratory Diseases (NCIRD), Division of Viral Diseases This information is not intended to replace advice given to you by your health care provider. Make sure you discuss any questions you have with your healthcare provider. Document Revised: 07/18/2020 Document Reviewed: 07/18/2020 Elsevier Patient Education  2022 Elsevier Inc.  

## 2020-12-17 NOTE — Progress Notes (Signed)
Virtual Visit via MyChart   This visit type was conducted due to national recommendations for restrictions regarding the COVID-19 Pandemic (e.g. social distancing) in an effort to limit this patient's exposure and mitigate transmission in our community.  Due to her co-morbid illnesses, this patient is at least at moderate risk for complications without adequate follow up.  This format is felt to be most appropriate for this patient at this time.  All issues noted in this document were discussed and addressed.  A limited physical exam was performed with this format.    This visit type was conducted due to national recommendations for restrictions regarding the COVID-19 Pandemic (e.g. social distancing) in an effort to limit this patient's exposure and mitigate transmission in our community.  Patients identity confirmed using two different identifiers.  This format is felt to be most appropriate for this patient at this time.  All issues noted in this document were discussed and addressed.  No physical exam was performed (except for noted visual exam findings with Video Visits).    Date:  12/25/2020   ID:  Gina Ware, DOB 04-13-1996, MRN [id number]  Patient Location:  Home - spoke with Gina Ware and Gina Ware  Provider location:   Office    Chief Complaint:  white spots on her tonsils  History of Present Illness:    Gina Ware is a 25 y.o. female who presents via video conferencing for a telehealth visit today.    The patient does have symptoms concerning for COVID-19 infection (fever, chills, cough, or new shortness of breath).   Sore Throat  This is a new problem. The current episode started 1 to 4 weeks ago (On June 25th). There has been no fever. Pertinent negatives include no abdominal pain, congestion, coughing, shortness of breath or swollen glands. She has had no exposure to strep or mono. She has tried nothing for the symptoms.    Past Medical History:   Diagnosis Date   Contraception management    No past surgical history on file.   Current Meds  Medication Sig   etonogestrel (NEXPLANON) 68 MG IMPL implant 1 each by Subdermal route once.     Allergies:   Patient has no known allergies.   Social History   Tobacco Use   Smoking status: Never   Smokeless tobacco: Never  Vaping Use   Vaping Use: Never used  Substance Use Topics   Alcohol use: Never   Drug use: Never     Family Hx: The patient's family history includes Diabetes in her paternal grandfather; Healthy in her father and mother; Hypertension in her paternal grandfather. There is no history of Thyroid disease.  ROS:   Please see the history of present illness.    Review of Systems  HENT:  Positive for sore throat (white spots on her tonsils). Negative for congestion.   Respiratory:  Negative for cough, shortness of breath and wheezing.   Cardiovascular: Negative.  Negative for chest pain.  Gastrointestinal:  Negative for abdominal pain.  Skin: Negative.   Neurological:  Negative for dizziness and tingling.  Psychiatric/Behavioral: Negative.     All other systems reviewed and are negative.   Labs/Other Tests and Data Reviewed:    Recent Labs: No results found for requested labs within last 8760 hours.   Recent Lipid Panel No results found for: CHOL, TRIG, HDL, CHOLHDL, LDLCALC, LDLDIRECT  Wt Readings from Last 3 Encounters:  12/17/20 110 lb (49.9 kg)  10/30/19 103 lb (46.7 kg)  10/08/19 103 lb 3.2 oz (46.8 kg)     Exam:    Vital Signs:  Ht 5\' 4"  (1.626 m)   Wt 110 lb (49.9 kg)   LMP 11/29/2020   BMI 18.88 kg/m     Physical Exam Vitals reviewed.  Constitutional:      General: She is not in acute distress. HENT:     Head: Normocephalic.     Mouth/Throat:     Tonsils: Tonsillar exudate present. No tonsillar abscesses. 1+ on the right. 2+ on the left.  Eyes:     Conjunctiva/sclera: Conjunctivae normal.     Pupils: Pupils are equal, round,  and reactive to light.  Pulmonary:     Effort: Pulmonary effort is normal. No respiratory distress.  Neurological:     Mental Status: She is alert and oriented to person, place, and time.  Psychiatric:        Mood and Affect: Mood normal.        Behavior: Behavior normal.    ASSESSMENT & PLAN:    1. COVID-19 She is just completing her quarantine and is overall doing well from Covid  2. Swelling of tonsil Negative strep She has tonsillar enlargement to the left side with white exudate.  Will treat with amoxicillin, if not better she is to return call to office  - POCT rapid strep A   COVID-19 Education: The signs and symptoms of COVID-19 were discussed with the patient and how to seek care for testing (follow up with PCP or arrange E-visit).  The importance of social distancing was discussed today.  Patient Risk:   After full review of this patients clinical status, I feel that they are at least moderate risk at this time.  Time:   Today, I have spent 10 minutes/ seconds with the patient with telehealth technology discussing above diagnoses.     Medication Adjustments/Labs and Tests Ordered: Current medicines are reviewed at length with the patient today.  Concerns regarding medicines are outlined above.   Tests Ordered: Orders Placed This Encounter  Procedures   POCT rapid strep A    Medication Changes: No orders of the defined types were placed in this encounter.   Disposition:  Follow up prn  Signed, 12/01/2020, [name]

## 2020-12-25 ENCOUNTER — Other Ambulatory Visit: Payer: Self-pay | Admitting: Nurse Practitioner

## 2020-12-25 ENCOUNTER — Encounter: Payer: Self-pay | Admitting: Nurse Practitioner

## 2020-12-25 DIAGNOSIS — J039 Acute tonsillitis, unspecified: Secondary | ICD-10-CM

## 2020-12-25 DIAGNOSIS — J351 Hypertrophy of tonsils: Secondary | ICD-10-CM

## 2021-05-04 ENCOUNTER — Ambulatory Visit: Payer: Self-pay | Admitting: Nurse Practitioner

## 2021-08-27 ENCOUNTER — Ambulatory Visit: Payer: Self-pay | Admitting: Endocrinology

## 2021-09-22 ENCOUNTER — Ambulatory Visit: Payer: Medicaid Other | Admitting: Nurse Practitioner

## 2021-09-23 ENCOUNTER — Encounter: Payer: Self-pay | Admitting: Nurse Practitioner

## 2021-09-23 ENCOUNTER — Ambulatory Visit (INDEPENDENT_AMBULATORY_CARE_PROVIDER_SITE_OTHER): Payer: Federal, State, Local not specified - PPO | Admitting: Nurse Practitioner

## 2021-09-23 ENCOUNTER — Other Ambulatory Visit (HOSPITAL_COMMUNITY)
Admission: RE | Admit: 2021-09-23 | Discharge: 2021-09-23 | Disposition: A | Discharge status: Home / Self Care | Payer: Federal, State, Local not specified - PPO | Source: Ambulatory Visit | Attending: Nurse Practitioner | Admitting: Nurse Practitioner

## 2021-09-23 VITALS — BP 118/64 | HR 80 | Temp 98.5°F | Ht 64.0 in | Wt 106.0 lb

## 2021-09-23 DIAGNOSIS — B3749 Other urogenital candidiasis: Secondary | ICD-10-CM | POA: Insufficient documentation

## 2021-09-23 DIAGNOSIS — Z114 Encounter for screening for human immunodeficiency virus [HIV]: Secondary | ICD-10-CM | POA: Diagnosis not present

## 2021-09-23 DIAGNOSIS — Z113 Encounter for screening for infections with a predominantly sexual mode of transmission: Secondary | ICD-10-CM | POA: Insufficient documentation

## 2021-09-23 DIAGNOSIS — E0590 Thyrotoxicosis, unspecified without thyrotoxic crisis or storm: Secondary | ICD-10-CM

## 2021-09-23 DIAGNOSIS — Z1159 Encounter for screening for other viral diseases: Secondary | ICD-10-CM | POA: Diagnosis not present

## 2021-09-23 DIAGNOSIS — Z23 Encounter for immunization: Secondary | ICD-10-CM

## 2021-09-23 NOTE — Patient Instructions (Signed)
Hyperthyroidism  Hyperthyroidism is when the thyroid gland is too active (overactive). The thyroid gland is a small gland located in the lower front part of the neck, just in front of the windpipe (trachea). This gland makes hormones that help control how the body uses food for energy (metabolism) as well as how the heart and brain function. These hormones also play a role in keeping your bones strong. When the thyroid is overactive, it produces toomuch of a hormone called thyroxine. What are the causes? This condition may be caused by: Graves' disease. This is a disorder in which the body's disease-fighting system (immune system) attacks the thyroid gland. This is the most common cause. Inflammation of the thyroid gland. A tumor in the thyroid gland. Use of certain medicines, including: Prescription thyroid hormone replacement. Herbal supplements that mimic thyroid hormones. Amiodarone therapy. Solid or fluid-filled lumps within your thyroid gland (thyroid nodules). Taking in a large amount of iodine from foods or medicines. What increases the risk? You are more likely to develop this condition if: You are female. You have a family history of thyroid conditions. You smoke tobacco. You use a medicine called lithium. You take medicines that affect the immune system (immunosuppressants). What are the signs or symptoms? Symptoms of this condition include: Nervousness. Inability to tolerate heat. Unexplained weight loss. Diarrhea. Change in the texture of hair or skin. Heart skipping beats or making extra beats. Rapid heart rate. Loss of menstruation. Shaky hands. Fatigue. Restlessness. Sleep problems. Enlarged thyroid gland or a lump in the thyroid (nodule). You may also have symptoms of Graves' disease, which may include: Protruding eyes. Dry eyes. Red or swollen eyes. Problems with vision. How is this diagnosed? This condition may be diagnosed based on: Your symptoms and  medical history. A physical exam. Blood tests. Thyroid ultrasound. This test involves using sound waves to produce images of the thyroid gland. A thyroid scan. A radioactive substance is injected into a vein, and images show how much iodine is present in the thyroid. Radioactive iodine uptake test (RAIU). A small amount of radioactive iodine is given by mouth to see how much iodine the thyroid absorbs after a certain amount of time. How is this treated? Treatment depends on the cause and severity of the condition. Treatment may include: Medicines to reduce the amount of thyroid hormone your body makes. Radioactive iodine treatment (radioiodine therapy). This involves swallowing a small dose of radioactive iodine, in capsule or liquid form, to kill thyroid cells. Surgery to remove part or all of your thyroid gland. You may need to take thyroid hormone replacement medicine for the rest of your life after thyroid surgery. Medicines to help manage your symptoms. Follow these instructions at home:  Take over-the-counter and prescription medicines only as told by your health care provider. Do not use any products that contain nicotine or tobacco, such as cigarettes and e-cigarettes. If you need help quitting, ask your health care provider. Follow any instructions from your health care provider about diet. You may be instructed to limit foods that contain iodine. Keep all follow-up visits as told by your health care provider. This is important. You will need to have blood tests regularly so that your health care provider can monitor your condition. Contact a health care provider if: Your symptoms do not get better with treatment. You have a fever. You are taking thyroid hormone replacement medicine and you: Have symptoms of depression. Feel like you are tired all the time. Gain weight. Get help right   away if: You have chest pain. You have decreased alertness or a change in your awareness. You  have abdominal pain. You feel dizzy. You have a rapid heartbeat. You have an irregular heartbeat. You have difficulty breathing. Summary The thyroid gland is a small gland located in the lower front part of the neck, just in front of the windpipe (trachea). Hyperthyroidism is when the thyroid gland is too active (overactive) and produces too much of a hormone called thyroxine. The most common cause is Graves' disease, a disorder in which your immune system attacks the thyroid gland. Hyperthyroidism can cause various symptoms, such as unexplained weight loss, nervousness, inability to tolerate heat, or changes in your heartbeat. Treatment may include medicine to reduce the amount of thyroid hormone your body makes, radioiodine therapy, surgery, or medicines to manage symptoms. This information is not intended to replace advice given to you by your health care provider. Make sure you discuss any questions you have with your healthcare provider. Document Revised: 02/14/2020 Document Reviewed: 02/14/2020 Elsevier Patient Education  2022 Elsevier Inc.  

## 2021-09-23 NOTE — Progress Notes (Signed)
?Industrial/product designer as a Education administrator for [employer], [name].,have documented all relevant documentation on the behalf of Minette Brine, [name],as directed by  Minette Brine, [name] while in the presence of Minette Brine, [place]. ? ?This visit occurred during the SARS-CoV-2 public health emergency.  Safety protocols were in place, including screening questions prior to the visit, additional usage of staff PPE, and extensive cleaning of exam room while observing appropriate contact time as indicated for disinfecting solutions. ? ?Subjective:  ?  ? Patient ID: Gina Ware , female    DOB: February 27, 1996 , 26 y.o.   MRN: [id number] ? ? ?Chief Complaint  ?Patient presents with  ? Hyperthyroidism  ? ? ?HPI ? ?Patient presents for follow up for hyperthyroidism. ? ?Wt Readings from Last 3 Encounters: ?09/23/21 : 106 lb (48.1 kg) ?12/17/20 : 110 lb (49.9 kg) ?10/30/19 : 103 lb (46.7 kg) ? ?She is seeing a therapist - noticed she is not maintaining relationships with people. She is would like to see a psychiatrist ?  ? ?Thyroid Problem ?Presents for follow-up visit. Patient reports no anxiety, palpitations, weight gain or weight loss. (Night sweats) The symptoms have been improving.   ? ?Past Medical History:  ?Diagnosis Date  ? Contraception management   ?  ? ?Family History  ?Problem Relation Age of Onset  ? Healthy Mother   ? Healthy Father   ? Diabetes Paternal Grandfather   ? Hypertension Paternal Grandfather   ? Thyroid disease Neg Hx   ? ? ? ?Current Outpatient Medications:  ?  fluconazole (DIFLUCAN) 100 MG tablet, Take 1 tablet (100 mg total) by mouth daily. Take 1 tablet by mouth now repeat in 5 days, Disp: 2 tablet, Rfl: 0 ?  etonogestrel (NEXPLANON) 68 MG IMPL implant, 1 each by Subdermal route once., Disp: , Rfl:   ? ?No Known Allergies  ? ?Review of Systems  ?Constitutional: Negative.  Negative for weight gain and weight loss.  ?Respiratory: Negative.    ?Cardiovascular: Negative.  Negative for chest pain, palpitations and  leg swelling.  ?Gastrointestinal: Negative.   ?Neurological: Negative.   ?Psychiatric/Behavioral: Negative.  The patient is not nervous/anxious.    ? ?Today's Vitals  ? 09/23/21 1624  ?BP: 118/64  ?Pulse: 80  ?Temp: 98.5 ?F (36.9 ?C)  ?TempSrc: Oral  ?Weight: 106 lb (48.1 kg)  ?Height: 5\' 4"  (1.626 m)  ? ?Body mass index is 18.19 kg/m?.  ?BP Readings from Last 3 Encounters:  ?09/23/21 118/64  ?10/30/19 98/70  ?10/08/19 110/78  ? ? ? ?Objective:  ?Physical Exam ?Vitals reviewed.  ?Constitutional:   ?   General: She is not in acute distress. ?   Appearance: Normal appearance.  ?Cardiovascular:  ?   Rate and Rhythm: Normal rate and regular rhythm.  ?   Pulses: Normal pulses.  ?   Heart sounds: Normal heart sounds. No murmur heard. ?Pulmonary:  ?   Effort: Pulmonary effort is normal. No respiratory distress.  ?   Breath sounds: Normal breath sounds. No wheezing.  ?Skin: ?   General: Skin is warm and dry.  ?   Capillary Refill: Capillary refill takes less than 2 seconds.  ?Neurological:  ?   General: No focal deficit present.  ?   Mental Status: She is alert and oriented to person, place, and time.  ?Psychiatric:     ?   Mood and Affect: Mood normal.     ?   Behavior: Behavior normal.     ?   Thought Content: Thought  content normal.     ?   Judgment: Judgment normal.  ?  ? ?   ?Assessment And Plan:  ?   ?1. Hyperthyroidism ?Comments: previous history and has not been to Endocrinology. Will recheck levels today ?- Thyroid Panel With TSH ? ?2. Encounter for hepatitis C screening test for low risk patient ?Will check Hepatitis C screening due to recent recommendations to screen all adults 18 years and older ?- Hepatitis C antibody ? ?3. Screening for HIV without presence of risk factors ?- HIV Antibody (routine testing w rflx) ? ?4. Need for Tdap vaccination ?Will give tetanus vaccine today while in office. Refer to order management. TDAP will be administered to adults 84-45 years old every 10 years. ?- Tdap vaccine greater  than or equal to 7yo IM ? ?5. Screening for STD (sexually transmitted disease) ?- Urine cytology ancillary only ?  ? ? ?Patient was given opportunity to ask questions. Patient verbalized understanding of the plan and was able to repeat key elements of the plan. All questions were answered to their satisfaction.  ?Minette Brine, [name]  ? ?I, Minette Brine, [name], have reviewed all documentation for this visit. The documentation on 09/23/21 for the exam, diagnosis, procedures, and orders are all accurate and complete.  ? ?IF YOU HAVE BEEN REFERRED TO A SPECIALIST, IT MAY TAKE 1-2 WEEKS TO SCHEDULE/PROCESS THE REFERRAL. IF YOU HAVE NOT HEARD FROM US/SPECIALIST IN TWO WEEKS, PLEASE GIVE [place] A CALL AT [phone] X [phone].  ? ?THE PATIENT IS ENCOURAGED TO PRACTICE SOCIAL DISTANCING DUE TO THE COVID-19 PANDEMIC.   ?

## 2021-09-24 LAB — THYROID PANEL WITH TSH
Free Thyroxine Index: 2 (ref 1.2–4.9)
T3 Uptake Ratio: 27 % (ref 24–39)
T4, Total: 7.4 ug/dL (ref 4.5–12.0)
TSH: 0.702 u[IU]/mL (ref 0.450–4.500)

## 2021-09-24 LAB — HEPATITIS C ANTIBODY: Hep C Virus Ab: NONREACTIVE

## 2021-09-24 LAB — HIV ANTIBODY (ROUTINE TESTING W REFLEX): HIV Screen 4th Generation wRfx: NONREACTIVE

## 2021-09-25 LAB — URINE CYTOLOGY ANCILLARY ONLY
Bacterial Vaginitis-Urine: NEGATIVE
Candida Urine: POSITIVE — AB
Chlamydia: NEGATIVE
Comment: NEGATIVE
Comment: NEGATIVE
Comment: NORMAL
Neisseria Gonorrhea: NEGATIVE
Trichomonas: NEGATIVE

## 2021-09-26 ENCOUNTER — Encounter: Payer: Self-pay | Admitting: Nurse Practitioner

## 2021-09-28 ENCOUNTER — Other Ambulatory Visit: Payer: Self-pay | Admitting: Nurse Practitioner

## 2021-09-28 DIAGNOSIS — B3731 Acute candidiasis of vulva and vagina: Secondary | ICD-10-CM

## 2021-09-28 MED ORDER — FLUCONAZOLE 100 MG PO TABS: 100.0000 mg | ORAL_TABLET | Freq: Every day | ORAL | 0 refills | Status: DC

## 2021-09-29 ENCOUNTER — Encounter: Payer: Federal, State, Local not specified - PPO | Admitting: Radiology

## 2021-09-29 DIAGNOSIS — Z0289 Encounter for other administrative examinations: Secondary | ICD-10-CM

## 2021-09-29 NOTE — Progress Notes (Deleted)
   Gina Ware [birth year] [id number]   History:  26 y.o. G0 presents for annual exam. No GYN concerns, Nexplanon due to be removed   Gynecologic History No LMP recorded.   Contraception/Family planning: Nexplanon Sexually active: yes Last Pap: ***. Results were: {norm/abn:16337}   Obstetric History OB History  No obstetric history on file.     The following portions of the patient's history were reviewed and updated as appropriate: allergies, current medications, past family history, past medical history, past social history, past surgical history, and problem list.  Review of Systems Pertinent items noted in HPI and remainder of comprehensive ROS otherwise negative.   Past medical history, past surgical history, family history and social history were all reviewed and documented in the EPIC chart.   Exam:  There were no vitals filed for this visit. There is no height or weight on file to calculate BMI.  General appearance:  Normal Thyroid:  Symmetrical, normal in size, without palpable masses or nodularity. Respiratory  Auscultation:  Clear without wheezing or rhonchi Cardiovascular  Auscultation:  Regular rate, without rubs, murmurs or gallops  Edema/varicosities:  Not grossly evident Abdominal  Soft,nontender, without masses, guarding or rebound.  Liver/spleen:  No organomegaly noted  Hernia:  None appreciated  Skin  Inspection:  Grossly normal Breasts: Examined lying and sitting.   Right: Without masses, retractions, nipple discharge or axillary adenopathy.   Left: Without masses, retractions, nipple discharge or axillary adenopathy. Genitourinary   Inguinal/mons:  Normal without inguinal adenopathy  External genitalia:  Normal appearing vulva with no masses, tenderness, or lesions  BUS/Urethra/Skene's glands:  Normal without masses or exudate  Vagina:  Normal appearing with normal color and discharge, no lesions  Cervix:  Normal appearing without  discharge or lesions  Uterus:  Normal in size, shape and contour.  Mobile, nontender  Adnexa/parametria:     Rt: Normal in size, without masses or tenderness.   Lt: Normal in size, without masses or tenderness.  Anus and perineum: Normal   Patient informed chaperone available to be present for breast and pelvic exam. Patient has requested no chaperone to be present. Patient has been advised what will be completed during breast and pelvic exam.   Assessment/Plan:   There are no diagnoses linked to this encounter.    Discussed SBE, colonoscopy and DEXA screening as directed/appropriate. Recommend of exercise weekly, including weight bearing exercise. Encouraged the use of seatbelts and sunscreen. Return in 1 year for annual or as needed.   Gina Ware B WHNP-BC 2:08 PM 09/29/2021

## 2021-12-29 DIAGNOSIS — F411 Generalized anxiety disorder: Secondary | ICD-10-CM | POA: Diagnosis not present

## 2022-01-13 DIAGNOSIS — F411 Generalized anxiety disorder: Secondary | ICD-10-CM | POA: Diagnosis not present

## 2022-02-01 ENCOUNTER — Encounter: Payer: Federal, State, Local not specified - PPO | Admitting: Nurse Practitioner

## 2022-02-07 IMAGING — US US THYROID
1 series · 13 of 25 positions shown · non-contrast
Comparison: None.

CLINICAL DATA: Palpable abnormality. Swelling of the thyroid gland.

EXAM:
THYROID ULTRASOUND
TECHNIQUE: Ultrasound examination of the thyroid gland and adjacent soft
tissues was performed.

[Series 1: us thyroid · 0.04mm/px · 13 of 43 slices shown]
[im 1/43]
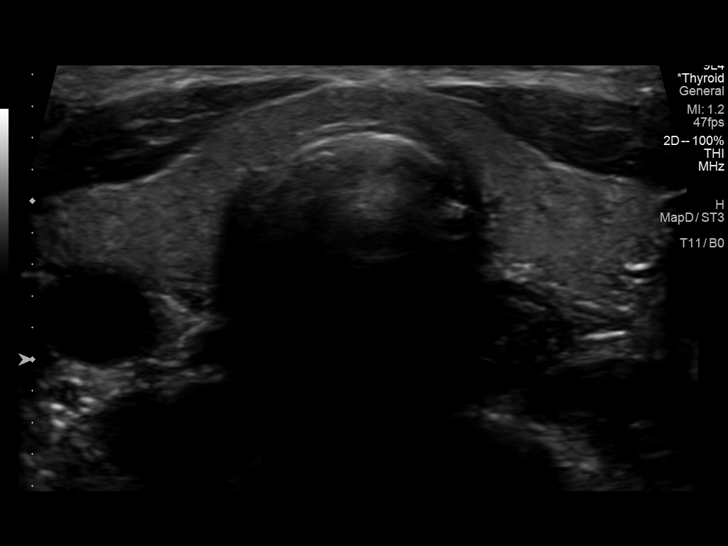
[im 4/43]
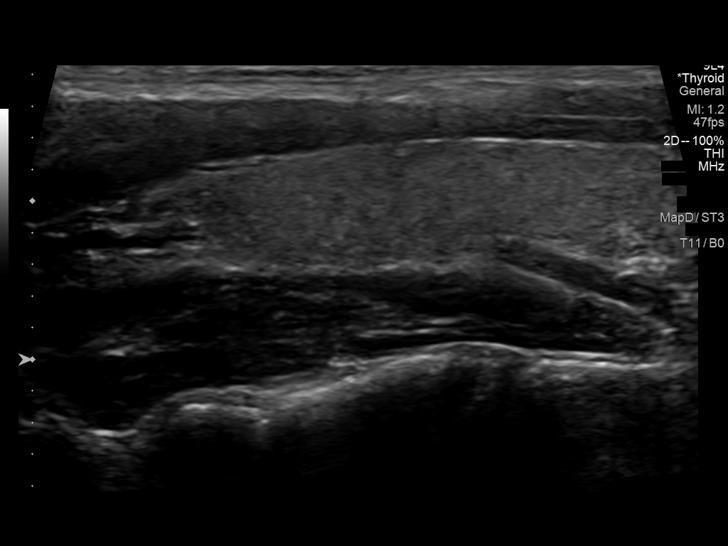
[im 8/43]
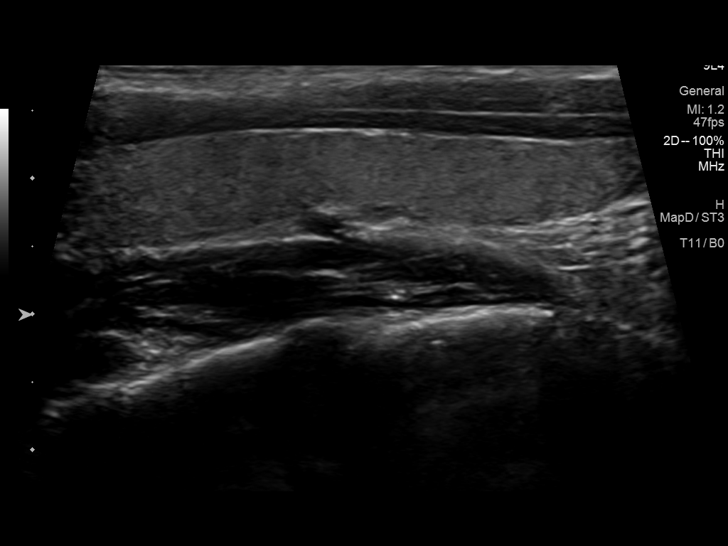
[im 11/43]
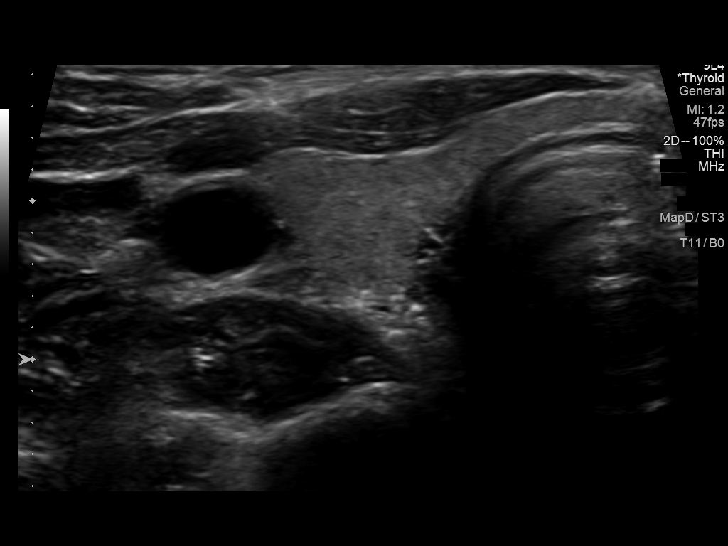
[im 15/43]
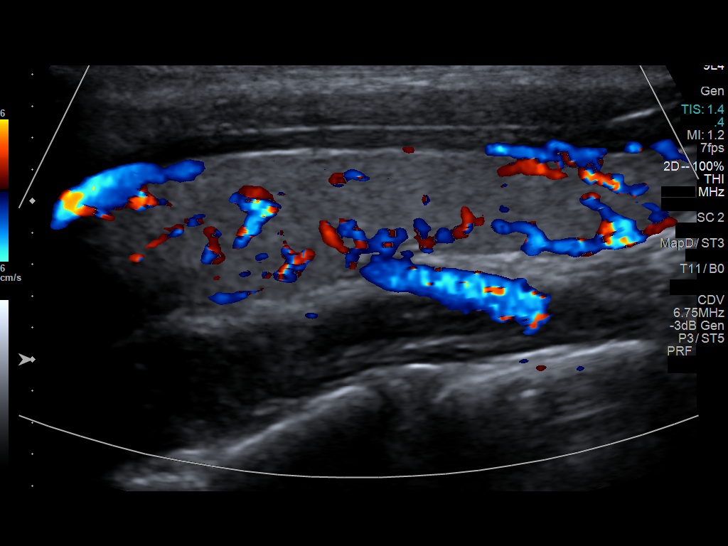
[im 18/43]
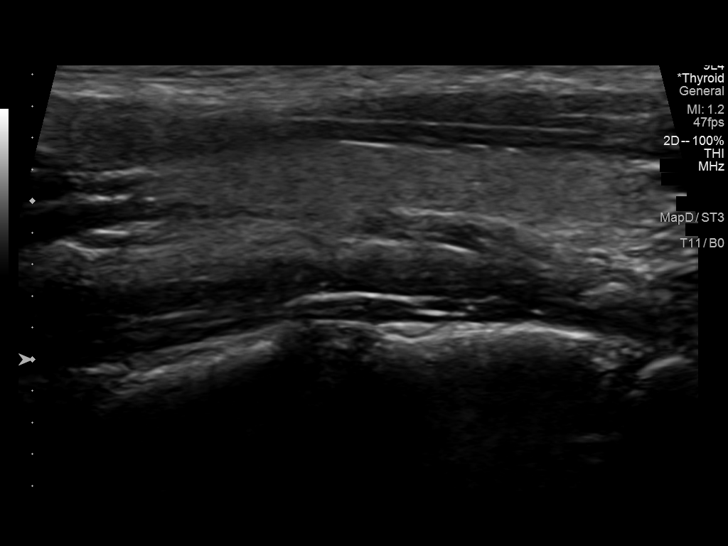
[im 22/43]
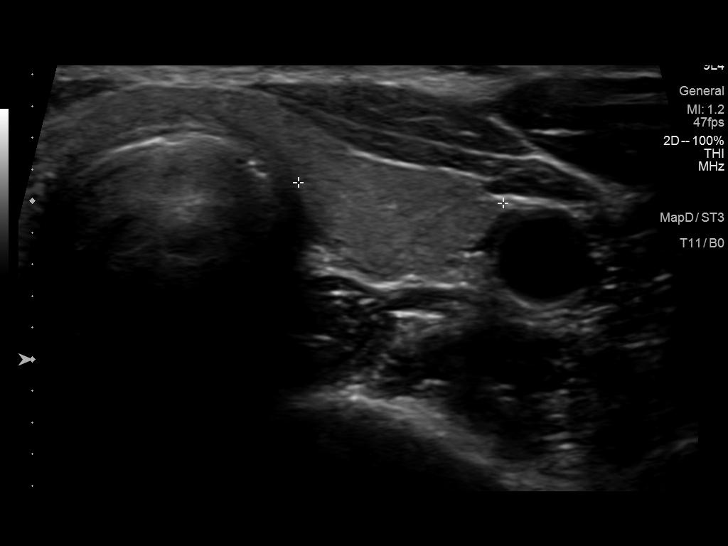
[im 25/43]
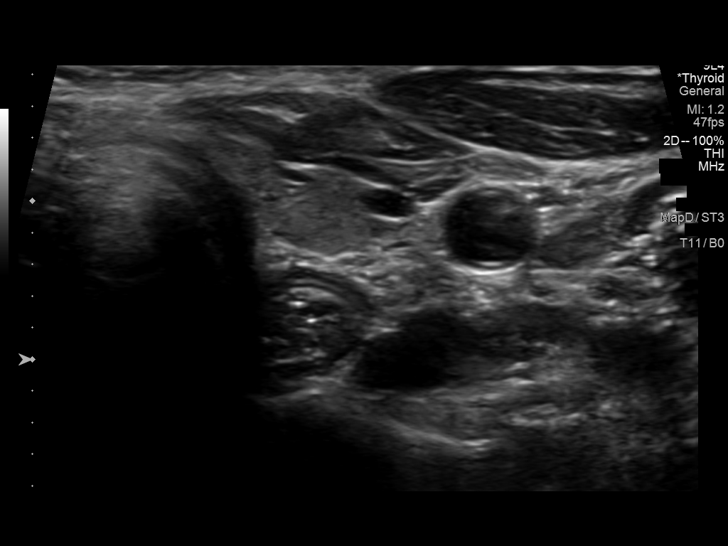
[im 29/43]
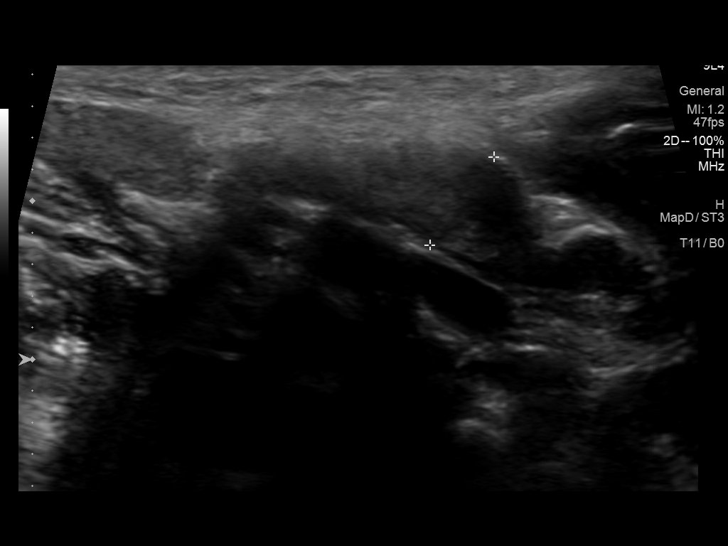
[im 32/43]
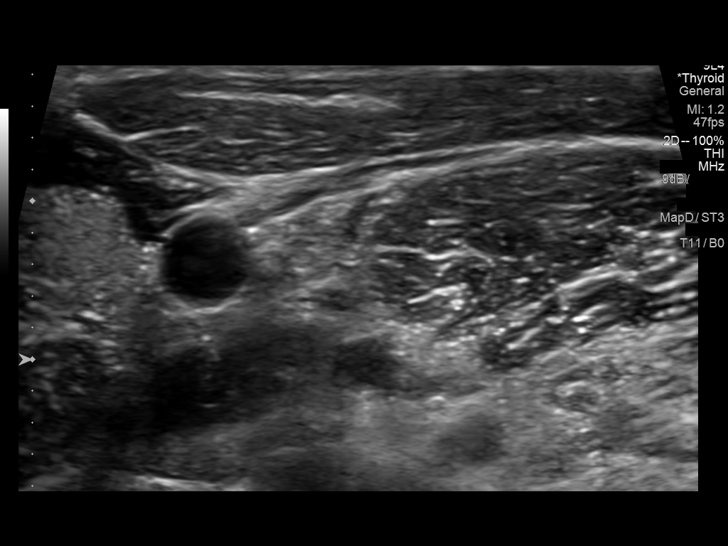
[im 36/43]
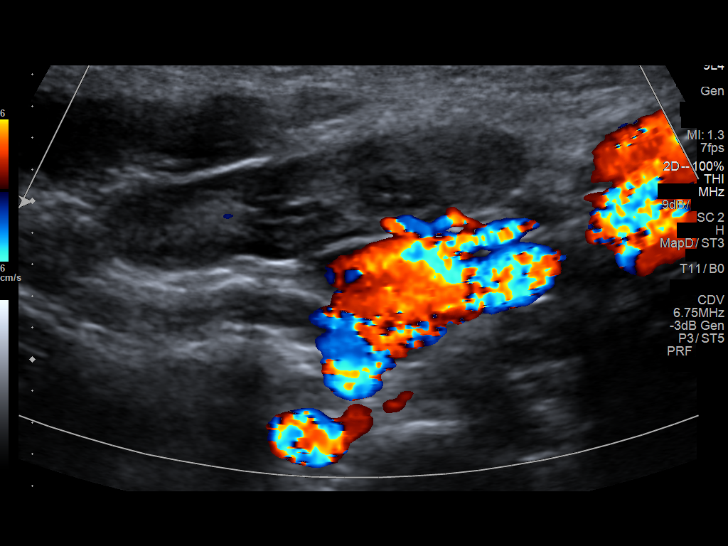
[im 39/43]
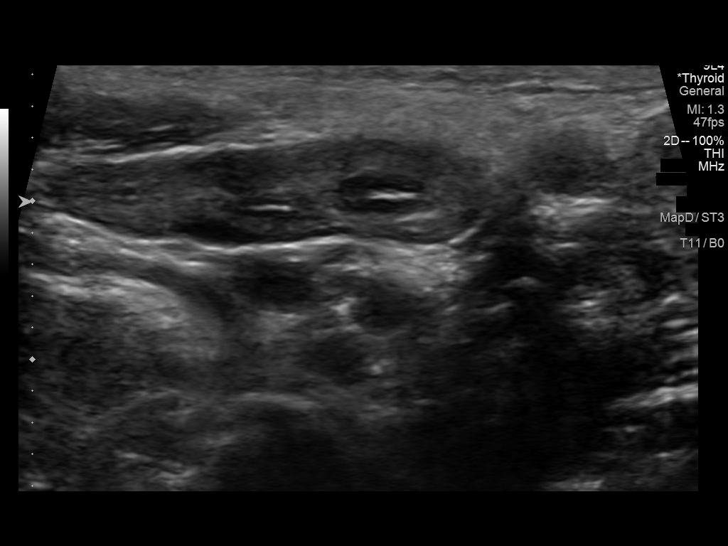
[im 43/43]
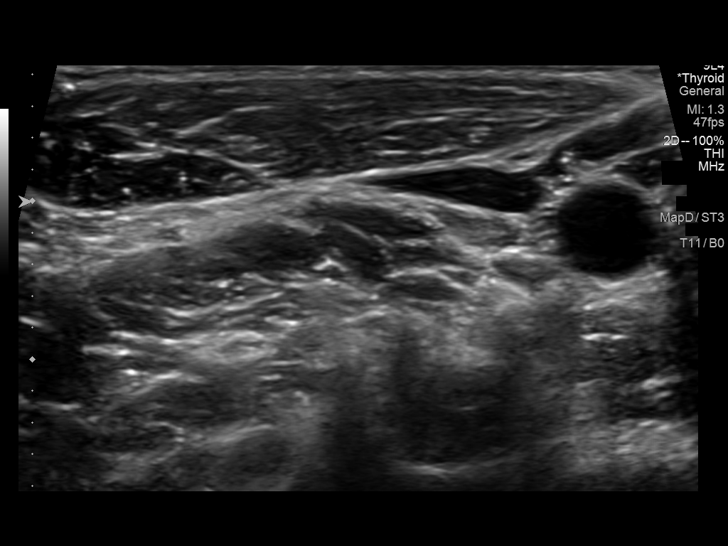

[13 of 25 positions shown; findings below may reference images not displayed]

FINDINGS: Parenchymal Echotexture: Mildly heterogenous

Isthmus: Normal in size measures 0.2 cm in diameter

Right lobe: Slightly atrophic in size measuring 4.2 x 0.7 x 1.4 cm

Left lobe: Slightly atrophic in size measuring 3.9 x 0.8 x 1.3 cm

_________________________________________________________

Estimated total number of nodules >/= 1 cm: 0

Number of spongiform nodules >/=  2 cm not described below (TR1): 0

Number of mixed cystic and solid nodules >/= 1.5 cm not described
below (TR2): 0

_________________________________________________________

No discrete nodules are seen within the thyroid gland.

Note is made of prominent though non pathologically enlarged
bilateral cervical lymph nodes with bilateral cervical lymph nodes
measuring 0.7 cm in greatest short axis diameter (left-image 30;
right-image 36).
IMPRESSION: 1. Mildly heterogeneous and slightly atrophic thyroid gland without
discrete nodule or mass. Findings are nonspecific though could be
seen in the setting of a thyroiditis. Correlation is advised.
2. Prominent though non pathologically enlarged bilateral cervical
lymph nodes, nonspecific though presumably reactive in etiology.

The above is in keeping with the ACR TI-RADS recommendations - [HOSPITAL] 0752;[DATE].

## 2022-02-17 DIAGNOSIS — Z20822 Contact with and (suspected) exposure to covid-19: Secondary | ICD-10-CM | POA: Diagnosis not present

## 2022-02-17 DIAGNOSIS — U071 COVID-19: Secondary | ICD-10-CM | POA: Diagnosis not present

## 2022-03-23 ENCOUNTER — Encounter (HOSPITAL_COMMUNITY): Payer: Self-pay

## 2022-03-23 ENCOUNTER — Emergency Department (HOSPITAL_COMMUNITY)
Admission: EM | Admit: 2022-03-23 | Discharge: 2022-03-23 | Disposition: A | Discharge status: Home / Self Care | Payer: No Typology Code available for payment source | Attending: Emergency Medicine | Admitting: Emergency Medicine

## 2022-03-23 ENCOUNTER — Emergency Department (HOSPITAL_COMMUNITY): Payer: No Typology Code available for payment source

## 2022-03-23 ENCOUNTER — Other Ambulatory Visit: Payer: Self-pay

## 2022-03-23 DIAGNOSIS — S8001XA Contusion of right knee, initial encounter: Secondary | ICD-10-CM | POA: Insufficient documentation

## 2022-03-23 DIAGNOSIS — M549 Dorsalgia, unspecified: Secondary | ICD-10-CM | POA: Insufficient documentation

## 2022-03-23 DIAGNOSIS — M7918 Myalgia, other site: Secondary | ICD-10-CM

## 2022-03-23 DIAGNOSIS — S8002XA Contusion of left knee, initial encounter: Secondary | ICD-10-CM | POA: Insufficient documentation

## 2022-03-23 DIAGNOSIS — S8991XA Unspecified injury of right lower leg, initial encounter: Secondary | ICD-10-CM | POA: Diagnosis present

## 2022-03-23 DIAGNOSIS — S8000XA Contusion of unspecified knee, initial encounter: Secondary | ICD-10-CM

## 2022-03-23 DIAGNOSIS — M25561 Pain in right knee: Secondary | ICD-10-CM | POA: Diagnosis not present

## 2022-03-23 DIAGNOSIS — M791 Myalgia, unspecified site: Secondary | ICD-10-CM | POA: Diagnosis not present

## 2022-03-23 MED ORDER — NAPROXEN 500 MG PO TABS: 500.0000 mg | ORAL_TABLET | Freq: Two times a day (BID) | ORAL | 0 refills | Status: DC

## 2022-03-23 MED ORDER — CYCLOBENZAPRINE HCL 10 MG PO TABS: 10.0000 mg | ORAL_TABLET | Freq: Two times a day (BID) | ORAL | 0 refills | Status: DC | PRN

## 2022-03-23 NOTE — Discharge Instructions (Signed)
Naproxen and Flexeril as needed as prescribed for muscle soreness.  Do not drive or operate machinery while taking Flexeril. Can apply warm compresses for 20 minutes at a time, follow with gentle stretching.  Recheck with your primary care provider if pain continues.
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
## 2022-03-25 ENCOUNTER — Telehealth: Payer: Medicaid Other | Admitting: Family Medicine

## 2022-03-25 ENCOUNTER — Ambulatory Visit (INDEPENDENT_AMBULATORY_CARE_PROVIDER_SITE_OTHER): Payer: Federal, State, Local not specified - PPO | Admitting: Nurse Practitioner

## 2022-03-25 ENCOUNTER — Encounter: Payer: Self-pay | Admitting: Nurse Practitioner

## 2022-03-25 VITALS — BP 110/80 | HR 99 | Temp 98.2°F | Ht 64.0 in | Wt 101.0 lb

## 2022-03-25 DIAGNOSIS — M549 Dorsalgia, unspecified: Secondary | ICD-10-CM

## 2022-03-25 DIAGNOSIS — M545 Low back pain, unspecified: Secondary | ICD-10-CM | POA: Diagnosis not present

## 2022-03-25 DIAGNOSIS — Z23 Encounter for immunization: Secondary | ICD-10-CM | POA: Diagnosis not present

## 2022-03-25 DIAGNOSIS — M25511 Pain in right shoulder: Secondary | ICD-10-CM
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
## 2022-04-01 DIAGNOSIS — Z3202 Encounter for pregnancy test, result negative: Secondary | ICD-10-CM | POA: Diagnosis not present

## 2022-04-01 DIAGNOSIS — M545 Low back pain, unspecified: Secondary | ICD-10-CM | POA: Diagnosis not present

## 2022-04-13 DIAGNOSIS — M545 Low back pain, unspecified: Secondary | ICD-10-CM | POA: Diagnosis not present

## 2022-04-13 DIAGNOSIS — M6281 Muscle weakness (generalized): Secondary | ICD-10-CM | POA: Diagnosis not present

## 2022-04-14 ENCOUNTER — Encounter: Payer: Self-pay | Admitting: Nurse Practitioner

## 2022-04-17 ENCOUNTER — Encounter (HOSPITAL_COMMUNITY): Payer: Self-pay | Admitting: Emergency Medicine

## 2022-04-17 ENCOUNTER — Emergency Department (HOSPITAL_COMMUNITY)
Admission: EM | Admit: 2022-04-17 | Discharge: 2022-04-17 | Disposition: A | Discharge status: Home / Self Care | Payer: Federal, State, Local not specified - PPO | Attending: Emergency Medicine | Admitting: Emergency Medicine

## 2022-04-17 ENCOUNTER — Other Ambulatory Visit: Payer: Self-pay

## 2022-04-17 DIAGNOSIS — R404 Transient alteration of awareness: Secondary | ICD-10-CM | POA: Diagnosis not present

## 2022-04-17 DIAGNOSIS — F10929 Alcohol use, unspecified with intoxication, unspecified: Secondary | ICD-10-CM | POA: Diagnosis not present

## 2022-04-17 DIAGNOSIS — R1111 Vomiting without nausea: Secondary | ICD-10-CM | POA: Diagnosis not present

## 2022-04-17 DIAGNOSIS — R112 Nausea with vomiting, unspecified: Secondary | ICD-10-CM | POA: Insufficient documentation

## 2022-04-17 DIAGNOSIS — F10129 Alcohol abuse with intoxication, unspecified: Secondary | ICD-10-CM | POA: Diagnosis not present

## 2022-04-17 DIAGNOSIS — I959 Hypotension, unspecified: Secondary | ICD-10-CM | POA: Diagnosis not present

## 2022-04-17 DIAGNOSIS — Y908 Blood alcohol level of 240 mg/100 ml or more: Secondary | ICD-10-CM | POA: Diagnosis not present

## 2022-04-17 DIAGNOSIS — F10920 Alcohol use, unspecified with intoxication, uncomplicated: Secondary | ICD-10-CM

## 2022-04-17 LAB — CBC WITH DIFFERENTIAL/PLATELET
Abs Immature Granulocytes: 0.03 10*3/uL (ref 0.00–0.07)
Basophils Absolute: 0.1 10*3/uL (ref 0.0–0.1)
Basophils Relative: 1 %
Eosinophils Absolute: 0.1 10*3/uL (ref 0.0–0.5)
Eosinophils Relative: 1 %
HCT: 39.3 % (ref 36.0–46.0)
Hemoglobin: 13.3 g/dL (ref 12.0–15.0)
Immature Granulocytes: 0 %
Lymphocytes Relative: 20 %
Lymphs Abs: 2 10*3/uL (ref 0.7–4.0)
MCH: 32.2 pg (ref 26.0–34.0)
MCHC: 33.8 g/dL (ref 30.0–36.0)
MCV: 95.2 fL (ref 80.0–100.0)
Monocytes Absolute: 0.5 10*3/uL (ref 0.1–1.0)
Monocytes Relative: 5 %
Neutro Abs: 7.2 10*3/uL (ref 1.7–7.7)
Neutrophils Relative %: 73 %
Platelets: 292 10*3/uL (ref 150–400)
RBC: 4.13 MIL/uL (ref 3.87–5.11)
RDW: 13.1 % (ref 11.5–15.5)
WBC: 10 10*3/uL (ref 4.0–10.5)
nRBC: 0 % (ref 0.0–0.2)

## 2022-04-17 LAB — COMPREHENSIVE METABOLIC PANEL
ALT: 15 U/L (ref 0–44)
AST: 16 U/L (ref 15–41)
Albumin: 4.8 g/dL (ref 3.5–5.0)
Alkaline Phosphatase: 47 U/L (ref 38–126)
Anion gap: 8 (ref 5–15)
BUN: 12 mg/dL (ref 6–20)
CO2: 23 mmol/L (ref 22–32)
Calcium: 8.9 mg/dL (ref 8.9–10.3)
Chloride: 109 mmol/L (ref 98–111)
Creatinine, Ser: 0.65 mg/dL (ref 0.44–1.00)
GFR, Estimated: >60 mL/min (ref >60)
Glucose, Bld: 101 mg/dL — ABNORMAL HIGH (ref 70–99)
Potassium: 3.5 mmol/L (ref 3.5–5.1)
Sodium: 140 mmol/L (ref 135–145)
Total Bilirubin: 0.7 mg/dL (ref 0.3–1.2)
Total Protein: 7.8 g/dL (ref 6.5–8.1)

## 2022-04-17 LAB — ETHANOL: Alcohol, Ethyl (B): 278 mg/dL — ABNORMAL HIGH (ref <10)

## 2022-04-17 MED ORDER — ONDANSETRON HCL 4 MG/2ML IJ SOLN
4.0000 mg | Freq: Once | INTRAMUSCULAR | Status: AC
  Administered 2022-04-17: 4 mg via INTRAVENOUS
  Filled 2022-04-17: qty 2

## 2022-04-17 MED ORDER — SODIUM CHLORIDE 0.9 % IV BOLUS
1000.0000 mL | Freq: Once | INTRAVENOUS | Status: AC
  Administered 2022-04-17: 1000 mL via INTRAVENOUS
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
## 2022-04-17 NOTE — ED Notes (Signed)
Pt ambulatory without staff assistance. Pt maintains steady and equal gait.

## 2022-04-17 NOTE — ED Notes (Signed)
Notified EDP that pt requests to leave. Pt called uber upon discharge and I walked pt to ED Lobby where pt waiting for uber.

## 2022-04-17 NOTE — Discharge Instructions (Signed)
Return for any problem.  ?
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
## 2022-04-21 ENCOUNTER — Telehealth: Payer: Self-pay

## 2022-04-21 DIAGNOSIS — M545 Low back pain, unspecified: Secondary | ICD-10-CM | POA: Diagnosis not present

## 2022-04-21 DIAGNOSIS — M6281 Muscle weakness (generalized): Secondary | ICD-10-CM | POA: Diagnosis not present

## 2022-04-21 NOTE — Telephone Encounter (Signed)
Transition Care Management Unsuccessful Follow-up Telephone Call  Date of discharge and from where:  04/17/2022  Attempts:  1st Attempt  Reason for unsuccessful TCM follow-up call:  Left voice message

## 2022-04-21 NOTE — Telephone Encounter (Signed)
Transition Care Management Follow-up Telephone Call Date of discharge and from where: 04/17/2022 [facility]  How have you been since you were released from the hospital? Pt states she is doing better, just elft from PT. She is not interested in an appointment, if anything changes she will give the office a call for a appointment.  Any questions or concerns? No  Items Reviewed: Did the pt receive and understand the discharge instructions provided? Yes  Medications obtained and verified? Yes  Other? No  Any new allergies since your discharge? No  Dietary orders reviewed? Yes Do you have support at home? Yes   Home Care and Equipment/Supplies: Were home health services ordered? no If so, what is the name of the agency? N/a  Has the agency set up a time to come to the patient's home? no Were any new equipment or medical supplies ordered?  No What is the name of the medical supply agency? N/a Were you able to get the supplies/equipment? no Do you have any questions related to the use of the equipment or supplies? No  Functional Questionnaire: (I = Independent and D = Dependent) ADLs: i  Bathing/Dressing- i  Meal Prep- i  Eating- i  Maintaining continence- i  Transferring/Ambulation- i  Managing Meds- i  Follow up appointments reviewed:  PCP Hospital f/u appt confirmed? No  Scheduled to see n/a on n/a @ n/a. Specialist Hospital f/u appt confirmed? No  Scheduled to see n/a on n/a @ n/a. Are transportation arrangements needed? No  If their condition worsens, is the pt aware to call PCP or go to the Emergency Dept.? No Was the patient provided with contact information for the PCP's office or ED? No Was to pt encouraged to call back with questions or concerns? No

## 2022-05-03 DIAGNOSIS — M6281 Muscle weakness (generalized): Secondary | ICD-10-CM | POA: Diagnosis not present

## 2022-05-03 DIAGNOSIS — M545 Low back pain, unspecified: Secondary | ICD-10-CM | POA: Diagnosis not present

## 2022-05-24 DIAGNOSIS — M545 Low back pain, unspecified: Secondary | ICD-10-CM | POA: Diagnosis not present

## 2022-05-24 DIAGNOSIS — M6281 Muscle weakness (generalized): Secondary | ICD-10-CM | POA: Diagnosis not present

## 2022-05-31 DIAGNOSIS — M545 Low back pain, unspecified: Secondary | ICD-10-CM | POA: Diagnosis not present

## 2022-05-31 DIAGNOSIS — M6281 Muscle weakness (generalized): Secondary | ICD-10-CM | POA: Diagnosis not present

## 2022-06-28 ENCOUNTER — Encounter: Payer: Self-pay | Admitting: Nurse Practitioner

## 2022-07-07 ENCOUNTER — Ambulatory Visit: Payer: Federal, State, Local not specified - PPO | Admitting: Nurse Practitioner

## 2022-07-30 ENCOUNTER — Encounter: Payer: Self-pay | Admitting: Radiology

## 2022-07-30 ENCOUNTER — Ambulatory Visit (INDEPENDENT_AMBULATORY_CARE_PROVIDER_SITE_OTHER): Payer: Federal, State, Local not specified - PPO | Admitting: Radiology

## 2022-07-30 ENCOUNTER — Encounter: Payer: Federal, State, Local not specified - PPO | Admitting: Radiology

## 2022-07-30 ENCOUNTER — Other Ambulatory Visit (HOSPITAL_COMMUNITY)
Admission: RE | Admit: 2022-07-30 | Discharge: 2022-07-30 | Disposition: A | Discharge status: Home / Self Care | Payer: Federal, State, Local not specified - PPO | Source: Ambulatory Visit | Attending: Radiology | Admitting: Radiology

## 2022-07-30 VITALS — BP 102/66 | Ht 64.75 in | Wt 106.0 lb

## 2022-07-30 DIAGNOSIS — Z01419 Encounter for gynecological examination (general) (routine) without abnormal findings: Secondary | ICD-10-CM | POA: Diagnosis not present

## 2022-07-30 DIAGNOSIS — Z113 Encounter for screening for infections with a predominantly sexual mode of transmission: Secondary | ICD-10-CM | POA: Insufficient documentation

## 2022-07-30 NOTE — Progress Notes (Signed)
   Gina Ware [birth year] [id number]   History:  27 y.o. G0 presents for annual exam as a new patient. Transferred from [place], do not have her records yet. Hx of hypothyroidism, currently not on medication. Has an appt with her PCP to restart.   Gynecologic History No LMP recorded. Patient has had an implant.   Contraception/Family planning: Nexplanon Sexually active: yes Last Pap: 2021. Results were: normal per pt   Obstetric History OB History  Gravida Para Term Preterm AB Living  0 0 0 0 0 0  SAB IAB Ectopic Multiple Live Births  0 0 0 0 0     The following portions of the patient's history were reviewed and updated as appropriate: allergies, current medications, past family history, past medical history, past social history, past surgical history, and problem list.  Review of Systems Pertinent items noted in HPI and remainder of comprehensive ROS otherwise negative.   Past medical history, past surgical history, family history and social history were all reviewed and documented in the EPIC chart.   Exam:  Vitals:   07/30/22 1205  BP: 102/66  Weight: 106 lb (48.1 kg)  Height: 5' 4.75" (1.645 m)   Body mass index is 17.78 kg/m.  General appearance:  Normal Thyroid:  Symmetrical, normal in size, without palpable masses or nodularity. Respiratory  Auscultation:  Clear without wheezing or rhonchi Cardiovascular  Auscultation:  Regular rate, without rubs, murmurs or gallops  Edema/varicosities:  Not grossly evident Abdominal  Soft,nontender, without masses, guarding or rebound.  Liver/spleen:  No organomegaly noted  Hernia:  None appreciated  Skin  Inspection:  Grossly normal Breasts: Examined lying and sitting.   Right: Without masses, retractions, nipple discharge or axillary adenopathy.   Left: Without masses, retractions, nipple discharge or axillary adenopathy. Genitourinary   Inguinal/mons:  Normal without inguinal adenopathy  External genitalia:   Normal appearing vulva with no masses, tenderness, or lesions  BUS/Urethra/Skene's glands:  Normal without masses or exudate  Vagina:  Normal appearing with normal color and discharge, no lesions  Cervix:  Normal appearing without discharge or lesions  Uterus:  Normal in size, shape and contour.  Mobile, nontender  Adnexa/parametria:     Rt: Normal in size, without masses or tenderness.   Lt: Normal in size, without masses or tenderness.  Anus and perineum: Normal   Patient informed chaperone available to be present for breast and pelvic exam. Patient has requested no chaperone to be present. Patient has been advised what will be completed during breast and pelvic exam.   Assessment/Plan:   1. Well woman exam with routine gynecological exam  - Cytology - PAP( [place])  2. Screening for STDs (sexually transmitted diseases)  - Cytology - PAP( [place])     Discussed SBE, colonoscopy and DEXA screening as directed/appropriate. Recommend 144mns of exercise weekly, including weight bearing exercise. Encouraged the use of seatbelts and sunscreen. Return in 1 year for annual or as needed.   CRubbie BattiestB WHNP-BC 12:22 PM 07/30/2022

## 2022-08-02 LAB — CYTOLOGY - PAP
Chlamydia: NEGATIVE
Comment: NEGATIVE
Comment: NEGATIVE
Comment: NORMAL
Diagnosis: NEGATIVE
Neisseria Gonorrhea: NEGATIVE
Trichomonas: NEGATIVE

## 2022-08-05 ENCOUNTER — Ambulatory Visit: Payer: Federal, State, Local not specified - PPO | Admitting: Nurse Practitioner

## 2022-09-06 DIAGNOSIS — N898 Other specified noninflammatory disorders of vagina: Secondary | ICD-10-CM

## 2022-09-07 MED ORDER — FLUCONAZOLE 150 MG PO TABS: 150.0000 mg | ORAL_TABLET | Freq: Once | ORAL | 0 refills | Status: AC

## 2022-09-07 NOTE — Telephone Encounter (Signed)
Would you mind getting more details first regarding symptoms? Thanks.

## 2022-10-06 NOTE — Telephone Encounter (Signed)
error 

## 2022-12-06 DIAGNOSIS — R519 Headache, unspecified: Secondary | ICD-10-CM | POA: Diagnosis not present

## 2022-12-06 DIAGNOSIS — S0990XA Unspecified injury of head, initial encounter: Secondary | ICD-10-CM | POA: Diagnosis not present

## 2022-12-07 ENCOUNTER — Other Ambulatory Visit: Payer: Self-pay

## 2022-12-07 ENCOUNTER — Encounter (HOSPITAL_BASED_OUTPATIENT_CLINIC_OR_DEPARTMENT_OTHER): Payer: Self-pay | Admitting: Emergency Medicine

## 2022-12-07 ENCOUNTER — Emergency Department (HOSPITAL_BASED_OUTPATIENT_CLINIC_OR_DEPARTMENT_OTHER)
Admission: EM | Admit: 2022-12-07 | Discharge: 2022-12-07 | Disposition: A | Discharge status: Home / Self Care | Payer: Federal, State, Local not specified - PPO | Attending: Emergency Medicine | Admitting: Emergency Medicine

## 2022-12-07 ENCOUNTER — Emergency Department (HOSPITAL_BASED_OUTPATIENT_CLINIC_OR_DEPARTMENT_OTHER): Payer: Federal, State, Local not specified - PPO

## 2022-12-07 DIAGNOSIS — R519 Headache, unspecified: Secondary | ICD-10-CM | POA: Diagnosis not present

## 2022-12-07 DIAGNOSIS — H1133 Conjunctival hemorrhage, bilateral: Secondary | ICD-10-CM | POA: Insufficient documentation

## 2022-12-07 DIAGNOSIS — S022XXA Fracture of nasal bones, initial encounter for closed fracture: Secondary | ICD-10-CM | POA: Insufficient documentation

## 2022-12-07 DIAGNOSIS — S0993XA Unspecified injury of face, initial encounter: Secondary | ICD-10-CM | POA: Insufficient documentation

## 2022-12-07 LAB — BASIC METABOLIC PANEL
Anion gap: 11 (ref 5–15)
BUN: 13 mg/dL (ref 6–20)
CO2: 24 mmol/L (ref 22–32)
Calcium: 10.1 mg/dL (ref 8.9–10.3)
Chloride: 106 mmol/L (ref 98–111)
Creatinine, Ser: 0.67 mg/dL (ref 0.44–1.00)
GFR, Estimated: >60 mL/min (ref >60)
Glucose, Bld: 91 mg/dL (ref 70–99)
Potassium: 4.7 mmol/L (ref 3.5–5.1)
Sodium: 141 mmol/L (ref 135–145)

## 2022-12-07 MED ORDER — TETRACAINE HCL 0.5 % OP SOLN
2.0000 [drp] | Freq: Once | OPHTHALMIC | Status: AC
  Administered 2022-12-07: 2 [drp] via OPHTHALMIC
  Filled 2022-12-07: qty 4

## 2022-12-07 MED ORDER — FLUORESCEIN SODIUM 1 MG OP STRP
1.0000 | ORAL_STRIP | Freq: Once | OPHTHALMIC | Status: AC
  Administered 2022-12-07: 1 via OPHTHALMIC
  Filled 2022-12-07: qty 1

## 2022-12-07 MED ORDER — IOHEXOL 300 MG/ML  SOLN
100.0000 mL | Freq: Once | INTRAMUSCULAR | Status: AC | PRN
  Administered 2022-12-07: 60 mL via INTRAVENOUS

## 2022-12-07 MED ORDER — IBUPROFEN 600 MG PO TABS: 600.0000 mg | ORAL_TABLET | Freq: Four times a day (QID) | ORAL | 0 refills | Status: DC | PRN

## 2022-12-07 MED ORDER — LORAZEPAM 2 MG/ML IJ SOLN: 1.0000 mg | Freq: Once | INTRAMUSCULAR | Status: DC

## 2022-12-07 NOTE — ED Notes (Signed)
Visual Acuity - Normally wears contacts/glasses.. Not currently wearing and doesn't have any on site.Marland KitchenMarland Kitchen

## 2022-12-07 NOTE — Discharge Instructions (Addendum)
Your workup today was overall reassuring.  You did have evidence of nasal bone fracture.  Recommend follow-up with ENT for reassessment.  Does not like you have bleeding behind your eye.  Recommend follow-up with primary care for reassessment of your symptoms.  Please do not hesitate to return to emergency department for worrisome signs and symptoms we discussed become apparent.
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
## 2022-12-07 NOTE — ED Notes (Signed)
Discharge paperwork given and verbally understood. 

## 2022-12-23 ENCOUNTER — Telehealth: Payer: Self-pay

## 2022-12-23 NOTE — Transitions of Care (Post Inpatient/ED Visit) (Signed)
   12/23/2022  Name: Gina Ware MRN: [id number] DOB: 17-Oct-1995  Today's TOC FU Call Status: Today's TOC FU Call Status:: Unsuccessul Call (1st Attempt) Unsuccessful Call (1st Attempt) Date: 12/23/22  Attempted to reach the patient regarding the most recent Inpatient/ED visit.  Follow Up Plan: No further outreach attempts will be made at this time. We have been unable to contact the patient.  Signature YL,RMA

## 2023-03-06 ENCOUNTER — Emergency Department (HOSPITAL_BASED_OUTPATIENT_CLINIC_OR_DEPARTMENT_OTHER)
Admission: EM | Admit: 2023-03-06 | Discharge: 2023-03-06 | Disposition: A | Discharge status: Home / Self Care | Payer: Federal, State, Local not specified - PPO | Attending: Emergency Medicine | Admitting: Emergency Medicine

## 2023-03-06 ENCOUNTER — Encounter (HOSPITAL_BASED_OUTPATIENT_CLINIC_OR_DEPARTMENT_OTHER): Payer: Self-pay

## 2023-03-06 ENCOUNTER — Other Ambulatory Visit: Payer: Self-pay

## 2023-03-06 DIAGNOSIS — E039 Hypothyroidism, unspecified: Secondary | ICD-10-CM | POA: Diagnosis not present

## 2023-03-06 DIAGNOSIS — S61306A Unspecified open wound of right little finger with damage to nail, initial encounter: Secondary | ICD-10-CM | POA: Diagnosis not present

## 2023-03-06 DIAGNOSIS — S61307A Unspecified open wound of left little finger with damage to nail, initial encounter: Secondary | ICD-10-CM | POA: Diagnosis not present

## 2023-03-06 DIAGNOSIS — W450XXA Nail entering through skin, initial encounter: Secondary | ICD-10-CM | POA: Diagnosis not present

## 2023-03-06 DIAGNOSIS — L609 Nail disorder, unspecified: Secondary | ICD-10-CM | POA: Diagnosis not present

## 2023-03-06 DIAGNOSIS — S61309A Unspecified open wound of unspecified finger with damage to nail, initial encounter: Secondary | ICD-10-CM

## 2023-03-06 NOTE — Discharge Instructions (Signed)
Please follow-up with your primary care provider regard recent and ER visit.  Today your exam shows that you have a nail avulsion in which your nail will eventually fall off and when we discussed the possibility of removing the nail today however agreed to that the nail fall off naturally.  You may take Tylenol every 6 hours as needed for pain.  Please monitor your symptoms and if you begin having redness/warmth to the end of the finger, pus, severe pain or other changing or worsening of symptoms please return to ER.

## 2023-03-06 NOTE — ED Triage Notes (Signed)
Pt presents with an nail injury to the pinky finger on the R hand. Pt reports she caught her nail and it "ripped to the cuticle."

## 2023-03-06 NOTE — ED Provider Notes (Addendum)
[facility] EMERGENCY DEPARTMENT AT [facility] Provider Note   CSN: [id number] Arrival date & time: 03/06/23  1435     History  Chief Complaint  Patient presents with   Nail Problem    Gina Ware is a 27 y.o. female history of hypothyroidism presented for nail problem.  Patient states yesterday she went to raise the car window and her fake nail got stuck on the window and nearly took the nail off.  Patient states that she still feeling that her finger states that the nail feels loose.  Home Medications Prior to Admission medications   Medication Sig Start Date End Date Taking? Authorizing Provider  etonogestrel (NEXPLANON) 68 MG IMPL implant 1 each by Subdermal route once.    [provider]  ibuprofen (ADVIL) 600 MG tablet Take 1 tablet (600 mg total) by mouth every 6 (six) hours as needed. 12/07/22   Peter Garter, PA      Allergies    Patient has no known allergies.    Review of Systems   Review of Systems  Physical Exam Updated Vital Signs BP 119/73 (BP Location: Right Arm)   Pulse 66   Temp 98.2 F (36.8 C) (Oral)   Resp 20   Ht 5\' 4"  (1.626 m)   Wt 49.9 kg   SpO2 100%   BMI 18.88 kg/m  Physical Exam Constitutional:      General: She is not in acute distress.    Comments: Resting comfortably on phone  Cardiovascular:     Rate and Rhythm: Normal rate.     Pulses: Normal pulses.  Musculoskeletal:        General: Normal range of motion.  Skin:    General: Skin is warm and dry.     Capillary Refill: Capillary refill takes less than 2 seconds.     Comments: Ring finger nail: Is loose however when touched does not come off No overlying skin color changes  Neurological:     Mental Status: She is alert.     Comments: Sensation intact distally     ED Results / Procedures / Treatments   Labs (all labs ordered are listed, but only abnormal results are displayed) Labs Reviewed - No data to display  EKG None  Radiology No  results found.  Procedures Procedures    Medications Ordered in ED Medications - No data to display  ED Course/ Medical Decision Making/ A&P                                 Medical Decision Making  Gina Ware 27 y.o. presented today for nail problem. Working DDx that I considered at this time includes, but not limited to, nail avulsion.  Review of prior external notes: 12/07/2022 ED  Unique Tests and My Interpretation: None  Discussion with Independent Historian: None  Discussion of Management of Tests: None  Risk: Low: based on diagnostic testing/clinical impression and treatment plan  Risk Stratification Score: None  Plan: On exam patient was in no acute distress with stable vitals.  On exam patient does have loose nail but is otherwise neurovascularly intact.  I spoke to the patient and we had shared decision making about if she wants the nail removed here or to keep the nail and let it fall off naturally and patient stated that she would rather have the nail fall off naturally.  I encouraged patient to use anti-inflammatories every 6  hours as needed for pain and to wash the finger and to follow-up with her primary care provider.  Patient given return precautions.  Patient does not have crush injury as the nail was lifted off with the car window and so do not feel imaging or labs are necessary at this time.  Patient was given return precautions. Patient stable for discharge at this time.  Patient verbalized understanding of plan.         Final Clinical Impression(s) / ED Diagnoses Final diagnoses:  Avulsion of fingernail, initial encounter    Rx / DC Orders ED Discharge Orders     None         Remi Deter 03/06/23 1624    Netta Corrigan, PA-C 03/06/23 1624    Virgina Norfolk, DO 03/06/23 2307

## 2023-03-06 NOTE — ED Notes (Signed)
Dc instructions reviewed with patient. Patient voiced understanding. Dc with belongings.  °

## 2023-03-08 ENCOUNTER — Encounter: Payer: Self-pay | Admitting: Nurse Practitioner

## 2023-03-24 ENCOUNTER — Ambulatory Visit: Payer: Federal, State, Local not specified - PPO | Admitting: Nurse Practitioner

## 2023-03-24 ENCOUNTER — Encounter: Payer: Self-pay | Admitting: Nurse Practitioner

## 2023-03-24 VITALS — BP 100/80 | HR 78 | Temp 98.5°F | Ht 64.0 in | Wt 104.4 lb

## 2023-03-24 DIAGNOSIS — E0590 Thyrotoxicosis, unspecified without thyrotoxic crisis or storm: Secondary | ICD-10-CM

## 2023-03-24 DIAGNOSIS — Z23 Encounter for immunization: Secondary | ICD-10-CM

## 2023-03-24 DIAGNOSIS — F32A Depression, unspecified: Secondary | ICD-10-CM

## 2023-03-24 DIAGNOSIS — Z2821 Immunization not carried out because of patient refusal: Secondary | ICD-10-CM

## 2023-03-24 DIAGNOSIS — F419 Anxiety disorder, unspecified: Secondary | ICD-10-CM
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
## 2023-03-24 NOTE — Patient Instructions (Signed)
You can take melatonin over the counter at least 3 mg for sleep or you can take aswhaghanda.

## 2023-03-25 LAB — TSH+FREE T4
Free T4: 1.37 ng/dL (ref 0.82–1.77)
TSH: 0.624 u[IU]/mL (ref 0.450–4.500)

## 2023-04-04 DIAGNOSIS — F32A Depression, unspecified: Secondary | ICD-10-CM | POA: Insufficient documentation

## 2023-04-04 DIAGNOSIS — Z23 Encounter for immunization: Secondary | ICD-10-CM | POA: Insufficient documentation

## 2023-04-04 DIAGNOSIS — Z2821 Immunization not carried out because of patient refusal: Secondary | ICD-10-CM | POA: Insufficient documentation

## 2023-04-04 NOTE — Assessment & Plan Note (Signed)
Will recheck her levels she has not been seen by Endocrinology in quite some time. Pending results may need to be re-referred.

## 2023-04-04 NOTE — Assessment & Plan Note (Signed)
Influenza vaccine administered Encouraged to take Tylenol as needed for fever or muscle aches.

## 2023-04-04 NOTE — Assessment & Plan Note (Signed)

## 2023-04-04 NOTE — Assessment & Plan Note (Addendum)
Depression screen score of 10, anxiety score is 11. Denies suicidal/homicidal ideations She is encouraged to take magnesium supplement daily and will refer for counseling.

## 2023-09-24 DIAGNOSIS — M25562 Pain in left knee: Secondary | ICD-10-CM | POA: Diagnosis not present

## 2023-12-23 ENCOUNTER — Encounter: Payer: Self-pay | Admitting: Nurse Practitioner

## 2024-01-30 ENCOUNTER — Telehealth: Payer: Self-pay

## 2024-01-30 NOTE — Telephone Encounter (Signed)
 LVM to make patient aware that her visit on 01/31/2024 for a lump on her neck can not be done virtual. Patients appt has been switched to in person.

## 2024-01-31 ENCOUNTER — Ambulatory Visit: Admitting: Nurse Practitioner

## 2024-01-31 ENCOUNTER — Telehealth: Payer: Self-pay

## 2024-01-31 NOTE — Telephone Encounter (Signed)
 I called patient twice to notify her we had to change her appointment today. Patient did not answer I have left her a voicemail. YL,RMA

## 2024-02-01 ENCOUNTER — Ambulatory Visit: Admitting: Family Medicine

## 2024-02-14 DIAGNOSIS — Z20822 Contact with and (suspected) exposure to covid-19: Secondary | ICD-10-CM | POA: Diagnosis not present

## 2024-02-14 DIAGNOSIS — R112 Nausea with vomiting, unspecified: Secondary | ICD-10-CM | POA: Diagnosis not present

## 2024-02-14 DIAGNOSIS — R1084 Generalized abdominal pain: Secondary | ICD-10-CM | POA: Diagnosis not present

## 2024-02-14 DIAGNOSIS — R197 Diarrhea, unspecified: Secondary | ICD-10-CM | POA: Diagnosis not present

## 2024-02-16 ENCOUNTER — Ambulatory Visit (HOSPITAL_COMMUNITY): Admission: EM | Admit: 2024-02-16 | Discharge: 2024-02-16 | Disposition: A | Discharge status: Home / Self Care

## 2024-02-16 DIAGNOSIS — R4589 Other symptoms and signs involving emotional state: Secondary | ICD-10-CM | POA: Diagnosis not present

## 2024-02-16 NOTE — ED Provider Notes (Signed)
 Behavioral Health Urgent Care Medical Screening Exam  Patient Name: Gina Ware MRN: [id number] Date of Evaluation: 02/17/24 Chief Complaint: want a referral detox program   Diagnosis:  Final diagnoses:  Anxious appearance    History of Present illness: Gina Ware is a 28 y.o. female.  With a history of alcohol intoxication, GAD, presented to [facility] voluntarily.  Per the patient she is trying to get a referral to a detox program.  According to her her last consumption of alcohol was last Saturday which was 5 days ago.  According to the patient she has a history of intoxication and currently has a DUI.  Patient does not have any withdrawal symptoms.  Patient does not seem to be in any distress.  However patient does seem to be looking for documentation for court.  Writer discussed with patient that we can admit her if she is trying to get detox however she has not had any alcohol in 5 days and she is not in withdrawal so with all of that we could not give her any medication.  Patient stated she was not looking to be admitted tonight she just wanted a referral for a program.  Because she has to go to work tomorrow.  According to her she works for the [employer] and she need to get [employer].  Writer discussed with patient that we do not fill out FMLA paperwork she would need to go to her primary care to have them done.  Patient does appear to be seeking secondary gain, because of her DUI court appearance.  A face-to-face evaluation of patient, patient is alert and oriented x 4, speech is clear, maintain eye contact.  Patient denies SI, HI, AVH or paranoia.  Reports alcohol consumption 5 days ago.  Patient does not drink on a daily basis but when she drinks she become intoxicated.  Patient has upcoming court appearance for DUI.  At this present moment patient does not present with any withdrawal symptoms.  Patient does not seem to be influenced by internal stimuli.  Does not appear to be in any immediate  distress.  Writer discussed with patient that should she need to be admitted for detox then we could admit her here however patient stated she is not looking to be admitted right now she just need a referral and she also need someone to fill out FMLA paperwork.  Patient was encouraged to reach out to her PCP for them to fill out her FMLA paperwork.  Commend discharge for patient to follow-up with PCP  Flowsheet Row ED from 02/16/2024 in [facility] from 03/06/2023 in [facility] Department at [facility] ED from 12/07/2022 in [facility] Emergency Department at [facility]  C-SSRS RISK CATEGORY No Risk No Risk No Risk    Psychiatric Specialty Exam  Presentation  General Appearance:Casual  Eye Contact:Good  Speech:Clear and Coherent  Speech Volume:Normal  Handedness:Right   Mood and Affect  Mood: Anxious  Affect: Appropriate   Thought Process  Thought Processes: Coherent  Descriptions of Associations:Intact  Orientation:Full (Time, Place and Person)  Thought Content:Logical    Hallucinations:None  Ideas of Reference:None  Suicidal Thoughts:No  Homicidal Thoughts:No   Sensorium  Memory: Immediate Fair  Judgment: Fair  Insight: Fair   Art therapist  Concentration: Fair  Attention Span: Fair  Recall: [employer] of Knowledge: Fair  Language: Fair   Psychomotor Activity  Psychomotor Activity: Normal   Assets  Assets: Desire for Improvement; Resilience  Sleep  Sleep: Fair  Number of hours:  6   Physical Exam: Physical Exam HENT:     Head: Normocephalic.     Nose: Nose normal.  Eyes:     Pupils: Pupils are equal, round, and reactive to light.  Cardiovascular:     Rate and Rhythm: Normal rate.  Pulmonary:     Effort: Pulmonary effort is normal.  Musculoskeletal:        General: Normal range of motion.     Cervical back: Normal range of motion.  Neurological:      General: No focal deficit present.     Mental Status: She is alert.  Psychiatric:        Mood and Affect: Mood normal.    Review of Systems  Constitutional: Negative.   HENT: Negative.    Eyes: Negative.   Respiratory: Negative.    Cardiovascular: Negative.   Gastrointestinal: Negative.   Genitourinary: Negative.   Musculoskeletal: Negative.   Skin: Negative.   Neurological: Negative.   Psychiatric/Behavioral:  Positive for substance abuse.    Blood pressure (!) 135/95, pulse 75, temperature 97.9 F (36.6 C), temperature source Oral, resp. rate 16, SpO2 100%. There is no height or weight on file to calculate BMI.  Musculoskeletal: Strength & Muscle Tone: within normal limits Gait & Station: normal Patient leans: N/A   [facility] MSE Discharge Disposition for Follow up and Recommendations: Based on my evaluation the patient does not appear to have an emergency medical condition and can be discharged with resources and follow up care in outpatient services for Substance Abuse Intensive Outpatient Program   Gaither Pouch, NP 02/17/2024, 5:20 AM
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
## 2024-02-16 NOTE — Discharge Instructions (Signed)
 Follow up with PCP

## 2024-02-17 ENCOUNTER — Encounter: Payer: Self-pay | Admitting: Family Medicine

## 2024-02-17 ENCOUNTER — Ambulatory Visit (INDEPENDENT_AMBULATORY_CARE_PROVIDER_SITE_OTHER): Admitting: Family Medicine

## 2024-02-17 VITALS — BP 126/82 | HR 89 | Wt 133.8 lb

## 2024-02-17 DIAGNOSIS — F1010 Alcohol abuse, uncomplicated: Secondary | ICD-10-CM

## 2024-02-17 MED ORDER — NALTREXONE HCL 50 MG PO TABS
50.0000 mg | ORAL_TABLET | Freq: Every day | ORAL | 2 refills | Status: AC
Start: 2024-02-17 — End: 2024-05-17

## 2024-02-17 NOTE — Progress Notes (Signed)
 Name: Gina Ware   Date of Visit: 02/17/24   Date of last visit with me: Visit date not found   CHIEF COMPLAINT:  Chief Complaint  Patient presents with   Acute Visit    Wanting to speak about anxiety, depression and alcohol abuse.         HPI:  Discussed the use of AI scribe software for clinical note transcription with the patient, who gave verbal consent to proceed.  History of Present Illness   Gina Ware is a 28 year old female who presents for a recommendation for an inpatient alcohol rehabilitation program. She has not had anything to drink for the last 5 days  She consumes alcohol daily and is under significant stress due to potential job loss related to her alcohol use. She has not experienced withdrawal symptoms in the past and is not currently experiencing them. She reports, 'I want to drink, but I know I can't drink.'  She is seeking a recommendation for an inpatient program and is already in contact with an outpatient behavior therapist. She has been in communication with [facility] and requires a referral to proceed.  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 She turned to alcohol as an alternative. She is interested in exploring medication options for anxiety and depression, as she is unable to use alcohol or marijuana to manage these conditions.  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 She inquires about documentation to provide to her employer to demonstrate her efforts to address her alcohol use.         OBJECTIVE:       03/24/2023    4:23 PM  Depression screen PHQ 2/9  Decreased Interest 1  Down, Depressed, Hopeless 0  PHQ - 2 Score 1  Altered sleeping 3  Tired, decreased energy 2  Change in appetite 3  Feeling bad or failure about yourself  1  Trouble concentrating 0  Moving slowly or  fidgety/restless 0  Suicidal thoughts 0  PHQ-9 Score 10  Difficult doing work/chores Not difficult at all     BP Readings from Last 3 Encounters:  02/17/24 126/82  02/16/24 (!) 135/95  03/24/23 100/80    BP 126/82   Pulse 89   Wt 133 lb 12.8 oz (60.7 kg)   SpO2 98%   BMI 22.97 kg/m    Physical Exam          Physical Exam  ASSESSMENT/PLAN:   Assessment & Plan Alcohol abuse    Assessment and Plan    Alcohol use disorder Chronic alcohol use disorder with daily drinking. No withdrawal symptoms. Desires cessation due to job stress and potential loss. Prefers craving-reducing medication before inpatient rehab. - Prescribe medication to reduce alcohol cravings, naltrexone  50mg  one pill daily with food. - Refer to [facility] for inpatient rehabilitation. - Provide documentation for her employer indicating efforts to address alcohol use disorder.  Depression and anxiety disorders Interested in medication alternatives. Discussed combining medication with therapy. - Discuss potential medication options for anxiety and depression with her primary care provider or psychiatrist. - Encourage continuation of outpatient behavioral therapy.  Psychosocial stressors related to employment Significant stress from potential job loss due to alcohol use. Seeks documentation for employer. - Provide a letter for her employer documenting today's visit and her efforts to address alcohol  use disorder.         Fatuma Dowers A. Vita MD [facility]

## 2024-02-18 ENCOUNTER — Other Ambulatory Visit: Payer: Self-pay | Admitting: Medical Genetics

## 2024-02-18 ENCOUNTER — Encounter: Payer: Self-pay | Admitting: Nurse Practitioner

## 2024-02-21 NOTE — Telephone Encounter (Signed)
 Please call to schedule her an appt to discuss further and complete forms.

## 2024-02-23 ENCOUNTER — Encounter: Payer: Self-pay | Admitting: Nurse Practitioner

## 2024-02-23 ENCOUNTER — Ambulatory Visit (INDEPENDENT_AMBULATORY_CARE_PROVIDER_SITE_OTHER): Admitting: Nurse Practitioner

## 2024-02-23 VITALS — BP 120/80 | HR 75 | Temp 99.2°F | Ht 64.0 in | Wt 135.4 lb

## 2024-02-23 DIAGNOSIS — J039 Acute tonsillitis, unspecified: Secondary | ICD-10-CM

## 2024-02-23 DIAGNOSIS — F1010 Alcohol abuse, uncomplicated: Secondary | ICD-10-CM

## 2024-02-23 DIAGNOSIS — E0590 Thyrotoxicosis, unspecified without thyrotoxic crisis or storm: Secondary | ICD-10-CM

## 2024-02-23 DIAGNOSIS — Z2821 Immunization not carried out because of patient refusal: Secondary | ICD-10-CM

## 2024-02-23 MED ORDER — AMOXICILLIN-POT CLAVULANATE 875-125 MG PO TABS
1.0000 | ORAL_TABLET | Freq: Two times a day (BID) | ORAL | 0 refills | Status: DC
Start: 2024-02-23 — End: 2024-03-12

## 2024-02-23 NOTE — Progress Notes (Signed)
 [name], CMA,acting as a Neurosurgeon for [name], [name].,have documented all relevant documentation on the behalf of [name], [name],as directed by  [name], [name] while in the presence of [name], [name].  Subjective:  Patient ID: Gina Ware , female    DOB: 1995-07-03 , 28 y.o.   MRN: [id number]  Chief Complaint  Patient presents with   Alcohol  Problem    Patient presents today for FMLA , Patient reports compliance with medication. Patient denies any chest pain, SOB, or headaches. Patient reports she is going to rehab for alcoholism. She reports she will be in rehab 03/08/2024-03/12/2024.   Hyperthyroidism    Patient reports she felt a lump in her thyroid  and soreness.      HPI  Discussed the use of AI scribe software for clinical note transcription with the patient, who gave verbal consent to proceed.  History of Present Illness Gina Ware is a 28 year old female who presents with a sensation of a lump in her throat and alcohol  use concerns.  She has been experiencing a sensation of a lump in her throat, particularly in the thyroid  area, which she can feel upon touching her neck. This sensation began around the time she made her original appointment on December 23, 2023. Two days ago, the area was sore, but drinking tea provided some relief. No fever or difficulty swallowing. She has a history of being referred to an endocrinologist in 2021 for thyroid  concerns but has not followed up with the specialist. She reports weight gain from 104 pounds in October to between 110 and 130 pounds, starting in March 2025.  She has been dealing with issues related to alcohol  use, including two DUIs. She is currently in therapy, although she feels it is not helping. She wants inpatient rehabilitation and has been in contact with a facility at [facility]. She last consumed alcohol  two days ago, drinking to the point of blacking out. Previously, she drank daily but has since  reduced her intake. She visited the ER on February 16, 2024, for behavioral health reasons, seeking admission for inpatient rehab, but was not admitted at that time. She was prescribed medication last week by another doctor to help with withdrawal symptoms, although she is unsure of the medication's name.   Past Medical History:  Diagnosis Date   Contraception management    Crohn's disease (HCC)    Hypothyroid      Family History  Problem Relation Age of Onset   Healthy Mother    Healthy Father    Diabetes Paternal Grandfather    Hypertension Paternal Grandfather    Thyroid  disease Neg Hx      Current Outpatient Medications:    amoxicillin -clavulanate (AUGMENTIN ) 875-125 MG tablet, Take 1 tablet by mouth 2 (two) times daily., Disp: 14 tablet, Rfl: 0   etonogestrel (NEXPLANON) 68 MG IMPL implant, 1 each by Subdermal route once., Disp: , Rfl:    naltrexone  (DEPADE) 50 MG tablet, Take 1 tablet (50 mg total) by mouth daily., Disp: 30 tablet, Rfl: 2   No Known Allergies   Review of Systems  Constitutional: Negative.   HENT: Negative.    Eyes: Negative.   Respiratory: Negative.  Negative for shortness of breath.   Cardiovascular: Negative.  Negative for chest pain, palpitations and leg swelling.  Gastrointestinal: Negative.  Negative for nausea.  Neurological: Negative.   Psychiatric/Behavioral:  The patient is not nervous/anxious.      Today's Vitals   02/23/24  1559  BP: 120/80  Pulse: 75  Temp: 99.2 F (37.3 C)  TempSrc: Oral  Weight: 135 lb 6.4 oz (61.4 kg)  Height: 5' 4 (1.626 m)  PainSc: 7   PainLoc: Neck   Body mass index is 23.24 kg/m.  Wt Readings from Last 3 Encounters:  02/23/24 135 lb 6.4 oz (61.4 kg)  02/17/24 133 lb 12.8 oz (60.7 kg)  03/24/23 104 lb 6.4 oz (47.4 kg)    Objective:  Physical Exam Vitals and nursing note reviewed.  Constitutional:      General: She is not in acute distress.    Appearance: Normal appearance.  HENT:     Head:  Normocephalic.     Right Ear: Tympanic membrane normal.     Left Ear: Tympanic membrane normal.     Nose: Nose normal.     Mouth/Throat:     Mouth: Mucous membranes are moist.     Pharynx: Posterior oropharyngeal erythema present.     Tonsils: 2+ on the right. 2+ on the left.  Cardiovascular:     Rate and Rhythm: Normal rate and regular rhythm.     Pulses: Normal pulses.     Heart sounds: Normal heart sounds. No murmur heard. Pulmonary:     Effort: Pulmonary effort is normal. No respiratory distress.     Breath sounds: Normal breath sounds. No wheezing.  Skin:    General: Skin is warm and dry.     Capillary Refill: Capillary refill takes less than 2 seconds.  Neurological:     General: No focal deficit present.     Mental Status: She is alert and oriented to person, place, and time.  Psychiatric:        Mood and Affect: Mood normal.        Behavior: Behavior normal.        Thought Content: Thought content normal.        Judgment: Judgment normal.         Assessment And Plan:  Hyperthyroidism Assessment & Plan: Weight gain since March. No recent thyroid  function tests. Previous endocrinologist referral without follow-up. - Order thyroid  function tests.  Orders: -     TSH + free T4 -     US  THYROID ; Future  COVID-19 vaccination declined  Influenza vaccination declined  Tonsillitis Assessment & Plan: Significantly enlarged tonsils without fever or swallowing difficulty. Differential includes tonsillitis. - Prescribe antibiotics. - Order neck ultrasound to evaluate thyroid  and surrounding structures.  Orders: -     Amoxicillin -Pot Clavulanate; Take 1 tablet by mouth 2 (two) times daily.  Dispense: 14 tablet; Refill: 0  Alcohol  abuse Assessment & Plan: Significant alcohol  use with blackout episode and history of DUIs. Therapy ineffective. Awaiting inpatient rehab admission. Discussed withdrawal risks. - Coordinate with [facility] for inpatient  rehabilitation admission. Will send message to Behavioral Health - Monitor for withdrawal symptoms and consider withdrawal management medication.      Return for 6 month thyroid  check.  Patient was given opportunity to ask questions. Patient verbalized understanding of the plan and was able to repeat key elements of the plan. All questions were answered to their satisfaction.    LILLETTE [name], [name], have reviewed all documentation for this visit. The documentation on 02/23/24 for the exam, diagnosis, procedures, and orders are all accurate and complete.   IF YOU HAVE BEEN REFERRED TO A SPECIALIST, IT MAY TAKE 1-2 WEEKS TO SCHEDULE/PROCESS THE REFERRAL. IF YOU HAVE NOT HEARD FROM US /SPECIALIST IN TWO WEEKS,  PLEASE GIVE US  A CALL AT [phone] X [phone].

## 2024-02-24 ENCOUNTER — Ambulatory Visit: Payer: Self-pay | Admitting: Nurse Practitioner

## 2024-02-24 ENCOUNTER — Telehealth (HOSPITAL_COMMUNITY): Payer: Self-pay | Admitting: Licensed Clinical Social Worker

## 2024-02-24 LAB — TSH+FREE T4
Free T4: 1.15 ng/dL (ref 0.82–1.77)
TSH: 0.282 u[IU]/mL — ABNORMAL LOW (ref 0.450–4.500)

## 2024-02-24 NOTE — Telephone Encounter (Signed)
 The therapist attempts to reach this patient in relation to a referral received from Dr. Reyne Jha's office.  This patient is reportedly seeking inpatient residential substance abuse treatment.  The therapist leaves a HIPAA compliant voicemail with the direct callback number for himself and Ms. Darice Simpler, LMFT, LCAS which is [phone].   This patient has healthy [employer].  Zell Maier, MA, LCSW, [facility], LCAS 02/24/2024

## 2024-02-28 ENCOUNTER — Telehealth (HOSPITAL_COMMUNITY): Payer: Self-pay

## 2024-02-28 ENCOUNTER — Other Ambulatory Visit

## 2024-02-28 NOTE — Telephone Encounter (Signed)
 Pt calls and leaves a voice mail that she was returning a phone call from Friday, 02-24-24 and is returning the call. This therapist returns the call and reaches voice mail. Therapist leaves a HIPAA compliant message requesting a return call. She has requested inpt residential facilities.  She has [facility] [employer].  Darice Simpler, MS, LMFT, LCAS

## 2024-02-28 NOTE — Telephone Encounter (Signed)
 Pt returns the call and says her doctor said that [facility] has a residential substance use program. This therapist explains that [place] does not have a residential substance use program. Pt says she will call her doctor back because he told her that [facility] does have a residential service.  She asks about inpt hospitalization. This therapist shares the possible reasons someone may be seeking inpt behavioral health hospitalization. Therapist discusses the other levels of care [place] offers in substance use.  Pt says she is specially interested in the residential program her doctor told her [facility] has so she will call him back.  Darice Simpler, MS, LMFT, LCAS

## 2024-03-04 ENCOUNTER — Encounter: Payer: Self-pay | Admitting: Nurse Practitioner

## 2024-03-04 DIAGNOSIS — J039 Acute tonsillitis, unspecified: Secondary | ICD-10-CM | POA: Insufficient documentation

## 2024-03-04 DIAGNOSIS — F1010 Alcohol abuse, uncomplicated: Secondary | ICD-10-CM | POA: Insufficient documentation

## 2024-03-04 NOTE — Assessment & Plan Note (Addendum)
 Significant alcohol  use with blackout episode and history of DUIs. Therapy ineffective. Awaiting inpatient rehab admission. Discussed withdrawal risks. - Coordinate with [facility] for inpatient rehabilitation admission. Will send message to Behavioral Health - Monitor for withdrawal symptoms and consider withdrawal management medication.

## 2024-03-04 NOTE — Assessment & Plan Note (Signed)
 Weight gain since March. No recent thyroid  function tests. Previous endocrinologist referral without follow-up. - Order thyroid  function tests.

## 2024-03-04 NOTE — Assessment & Plan Note (Signed)
 Significantly enlarged tonsils without fever or swallowing difficulty. Differential includes tonsillitis. - Prescribe antibiotics. - Order neck ultrasound to evaluate thyroid  and surrounding structures.

## 2024-03-05 ENCOUNTER — Other Ambulatory Visit: Payer: Self-pay | Admitting: Nurse Practitioner

## 2024-03-05 DIAGNOSIS — F1010 Alcohol abuse, uncomplicated: Secondary | ICD-10-CM

## 2024-03-05 DIAGNOSIS — E0590 Thyrotoxicosis, unspecified without thyrotoxic crisis or storm: Secondary | ICD-10-CM

## 2024-03-09 ENCOUNTER — Other Ambulatory Visit

## 2024-03-10 ENCOUNTER — Other Ambulatory Visit: Payer: Self-pay

## 2024-03-10 ENCOUNTER — Ambulatory Visit (HOSPITAL_COMMUNITY)
Admission: EM | Admit: 2024-03-10 | Discharge: 2024-03-10 | Disposition: A | Discharge status: Rehabilitation Facility | Attending: Psychiatry | Admitting: Psychiatry

## 2024-03-10 ENCOUNTER — Other Ambulatory Visit (HOSPITAL_COMMUNITY): Admission: EM | Admit: 2024-03-10 | Discharge: 2024-03-13 | Disposition: A | Discharge status: Home / Self Care

## 2024-03-10 DIAGNOSIS — Z818 Family history of other mental and behavioral disorders: Secondary | ICD-10-CM | POA: Insufficient documentation

## 2024-03-10 DIAGNOSIS — F1211 Cannabis abuse, in remission: Secondary | ICD-10-CM

## 2024-03-10 DIAGNOSIS — F332 Major depressive disorder, recurrent severe without psychotic features: Secondary | ICD-10-CM | POA: Diagnosis not present

## 2024-03-10 DIAGNOSIS — F411 Generalized anxiety disorder: Secondary | ICD-10-CM | POA: Insufficient documentation

## 2024-03-10 DIAGNOSIS — F1010 Alcohol abuse, uncomplicated: Secondary | ICD-10-CM | POA: Diagnosis not present

## 2024-03-10 DIAGNOSIS — F1090 Alcohol use, unspecified, uncomplicated: Secondary | ICD-10-CM | POA: Diagnosis present

## 2024-03-10 DIAGNOSIS — Z811 Family history of alcohol abuse and dependence: Secondary | ICD-10-CM | POA: Insufficient documentation

## 2024-03-10 DIAGNOSIS — F330 Major depressive disorder, recurrent, mild: Secondary | ICD-10-CM

## 2024-03-10 DIAGNOSIS — F1020 Alcohol dependence, uncomplicated: Secondary | ICD-10-CM

## 2024-03-10 LAB — CBC WITH DIFFERENTIAL/PLATELET
Abs Immature Granulocytes: 0.02 K/uL (ref 0.00–0.07)
Basophils Absolute: 0.1 K/uL (ref 0.0–0.1)
Basophils Relative: 1 %
Eosinophils Absolute: 0.5 K/uL (ref 0.0–0.5)
Eosinophils Relative: 6 %
HCT: 36.1 % (ref 36.0–46.0)
Hemoglobin: 12.1 g/dL (ref 12.0–15.0)
Immature Granulocytes: 0 %
Lymphocytes Relative: 41 %
Lymphs Abs: 3.4 K/uL (ref 0.7–4.0)
MCH: 29.8 pg (ref 26.0–34.0)
MCHC: 33.5 g/dL (ref 30.0–36.0)
MCV: 88.9 fL (ref 80.0–100.0)
Monocytes Absolute: 0.8 K/uL (ref 0.1–1.0)
Monocytes Relative: 10 %
Neutro Abs: 3.6 K/uL (ref 1.7–7.7)
Neutrophils Relative %: 42 %
Platelets: 343 K/uL (ref 150–400)
RBC: 4.06 MIL/uL (ref 3.87–5.11)
RDW: 13.2 % (ref 11.5–15.5)
WBC: 8.3 K/uL (ref 4.0–10.5)
nRBC: 0 % (ref 0.0–0.2)

## 2024-03-10 LAB — COMPREHENSIVE METABOLIC PANEL WITH GFR
ALT: 10 U/L (ref 0–44)
AST: 15 U/L (ref 15–41)
Albumin: 3.9 g/dL (ref 3.5–5.0)
Alkaline Phosphatase: 52 U/L (ref 38–126)
Anion gap: 7 (ref 5–15)
BUN: 11 mg/dL (ref 6–20)
CO2: 24 mmol/L (ref 22–32)
Calcium: 9 mg/dL (ref 8.9–10.3)
Chloride: 106 mmol/L (ref 98–111)
Creatinine, Ser: 0.75 mg/dL (ref 0.44–1.00)
GFR, Estimated: >60 mL/min (ref >60)
Glucose, Bld: 86 mg/dL (ref 70–99)
Potassium: 3.8 mmol/L (ref 3.5–5.1)
Sodium: 137 mmol/L (ref 135–145)
Total Bilirubin: 0.9 mg/dL (ref 0.0–1.2)
Total Protein: 6.6 g/dL (ref 6.5–8.1)

## 2024-03-10 LAB — LIPID PANEL
Cholesterol: 175 mg/dL (ref 0–200)
HDL: 88 mg/dL (ref >40)
LDL Cholesterol: 77 mg/dL (ref 0–99)
Total CHOL/HDL Ratio: 2 ratio
Triglycerides: 48 mg/dL (ref <150)
VLDL: 10 mg/dL (ref 0–40)

## 2024-03-10 LAB — ETHANOL: Alcohol, Ethyl (B): <15 mg/dL (ref <15)

## 2024-03-10 LAB — MAGNESIUM: Magnesium: 1.9 mg/dL (ref 1.7–2.4)

## 2024-03-10 LAB — HEMOGLOBIN A1C
Hgb A1c MFr Bld: 4.5 % — ABNORMAL LOW (ref 4.8–5.6)
Mean Plasma Glucose: 82.45 mg/dL

## 2024-03-10 LAB — TSH: TSH: 0.415 u[IU]/mL (ref 0.350–4.500)

## 2024-03-10 MED ORDER — ALUM & MAG HYDROXIDE-SIMETH 200-200-20 MG/5ML PO SUSP: 30.0000 mL | ORAL | Status: DC | PRN

## 2024-03-10 MED ORDER — MAGNESIUM HYDROXIDE 400 MG/5ML PO SUSP: 30.0000 mL | Freq: Every day | ORAL | Status: DC | PRN

## 2024-03-10 MED ORDER — ACETAMINOPHEN 325 MG PO TABS: 650.0000 mg | ORAL_TABLET | Freq: Four times a day (QID) | ORAL | Status: DC | PRN
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
## 2024-03-10 NOTE — Progress Notes (Signed)
   03/10/24 2125  Patient Reported Information  How Did You Hear About Us ? Self  What Is the Reason for Your Visit/Call Today? Pt reports, she has a history of depression, anxiety and alcohol  use. Pt reports, wanting treatment for substance use due to increase alcohol  consumption. Pt denies, SI, HI, hallucinations, self-injurious behaviors and access to weapons.  How Long Has This Been Causing You Problems? > than 6 months  What Do You Feel Would Help You the Most Today? Alcohol  or Drug Use Treatment;Stress Management  Have You Recently Had Any Thoughts About Hurting Yourself? No  Are You Planning to Commit Suicide/Harm Yourself At This time? No  Have you Recently Had Thoughts About Hurting Someone Sherral? No  Are You Planning To Harm Someone At This Time? No  Explanation: NA  Physical Abuse Denies  Verbal Abuse Denies  Sexual Abuse Denies  Exploitation of patient/patient's resources Denies  Self-Neglect Denies  Possible abuse reported to: Other (Comment) (NA)  Have You Used Any Alcohol  or Drugs in the Past 24 Hours? Yes  What Did You Use and How Much? Pt reports, taking 4-5 shots of alcohol  yesterday.  Do You Currently Have a Therapist/Psychiatrist? Yes  Name of Therapist/Psychiatrist Candace Coston, LCSW, therapist, she most recently seen last week.  Have You Been Recently Discharged From Any Office Practice or Programs? No  [facility] Screening Triage Referral Assessment  Determination of Need Urgent (48 hours)  Options For Referral Facility-Based Crisis;BH Urgent Care    Flowsheet Row ED from 03/10/2024 in [facility] ED from 02/16/2024 in [facility] ED from 03/06/2023 in [facility] Emergency Department at [facility]  C-SSRS RISK CATEGORY No Risk No Risk No Risk   Determination of need: Urgent.   Jackson JONETTA Broach, MS, [facility], [facility] Triage Specialist [phone]
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
## 2024-03-11 DIAGNOSIS — F1020 Alcohol dependence, uncomplicated: Secondary | ICD-10-CM

## 2024-03-11 DIAGNOSIS — F411 Generalized anxiety disorder: Secondary | ICD-10-CM | POA: Diagnosis not present

## 2024-03-11 DIAGNOSIS — F1211 Cannabis abuse, in remission: Secondary | ICD-10-CM

## 2024-03-11 DIAGNOSIS — F330 Major depressive disorder, recurrent, mild: Secondary | ICD-10-CM

## 2024-03-11 DIAGNOSIS — F1090 Alcohol use, unspecified, uncomplicated: Secondary | ICD-10-CM | POA: Diagnosis present

## 2024-03-11 LAB — POCT URINE DRUG SCREEN - MANUAL ENTRY (I-SCREEN)
POC Amphetamine UR: NOT DETECTED
POC Buprenorphine (BUP): NOT DETECTED
POC Cocaine UR: NOT DETECTED
POC Marijuana UR: POSITIVE — AB
POC Methadone UR: NOT DETECTED
POC Methamphetamine UR: NOT DETECTED
POC Morphine: NOT DETECTED
POC Oxazepam (BZO): NOT DETECTED
POC Oxycodone UR: NOT DETECTED
POC Secobarbital (BAR): NOT DETECTED

## 2024-03-11 LAB — POC URINE PREG, ED: Preg Test, Ur: NEGATIVE

## 2024-03-11 MED ORDER — ACETAMINOPHEN 325 MG PO TABS: 650.0000 mg | ORAL_TABLET | Freq: Four times a day (QID) | ORAL | Status: DC | PRN

## 2024-03-11 MED ORDER — ONDANSETRON 4 MG PO TBDP: 4.0000 mg | ORAL_TABLET | Freq: Four times a day (QID) | ORAL | Status: DC | PRN

## 2024-03-11 MED ORDER — ADULT MULTIVITAMIN W/MINERALS CH
1.0000 | ORAL_TABLET | Freq: Every day | ORAL | Status: DC
  Administered 2024-03-11 – 2024-03-13 (×3): 1 via ORAL
  Filled 2024-03-11 (×3): qty 1

## 2024-03-11 MED ORDER — HALOPERIDOL LACTATE 5 MG/ML IJ SOLN: 5.0000 mg | Freq: Three times a day (TID) | INTRAMUSCULAR | Status: DC | PRN

## 2024-03-11 MED ORDER — THIAMINE MONONITRATE 100 MG PO TABS
100.0000 mg | ORAL_TABLET | Freq: Every day | ORAL | Status: DC
  Administered 2024-03-12 – 2024-03-13 (×2): 100 mg via ORAL
  Filled 2024-03-11 (×2): qty 1

## 2024-03-11 MED ORDER — HALOPERIDOL 5 MG PO TABS: 5.0000 mg | ORAL_TABLET | Freq: Three times a day (TID) | ORAL | Status: DC | PRN

## 2024-03-11 MED ORDER — LORAZEPAM 1 MG PO TABS: 1.0000 mg | ORAL_TABLET | Freq: Four times a day (QID) | ORAL | Status: DC | PRN

## 2024-03-11 MED ORDER — THIAMINE HCL 100 MG/ML IJ SOLN: 100.0000 mg | Freq: Once | INTRAMUSCULAR | Status: DC

## 2024-03-11 MED ORDER — DIPHENHYDRAMINE HCL 50 MG/ML IJ SOLN: 50.0000 mg | Freq: Three times a day (TID) | INTRAMUSCULAR | Status: DC | PRN

## 2024-03-11 MED ORDER — LORAZEPAM 2 MG/ML IJ SOLN: 2.0000 mg | Freq: Three times a day (TID) | INTRAMUSCULAR | Status: DC | PRN

## 2024-03-11 MED ORDER — ALUM & MAG HYDROXIDE-SIMETH 200-200-20 MG/5ML PO SUSP: 30.0000 mL | ORAL | Status: DC | PRN

## 2024-03-11 MED ORDER — MAGNESIUM HYDROXIDE 400 MG/5ML PO SUSP: 30.0000 mL | Freq: Every day | ORAL | Status: DC | PRN

## 2024-03-11 MED ORDER — TRAZODONE HCL 50 MG PO TABS
50.0000 mg | ORAL_TABLET | Freq: Every evening | ORAL | Status: DC | PRN
  Administered 2024-03-11: 50 mg via ORAL
  Filled 2024-03-11: qty 1

## 2024-03-11 MED ORDER — DIPHENHYDRAMINE HCL 50 MG PO CAPS: 50.0000 mg | ORAL_CAPSULE | Freq: Three times a day (TID) | ORAL | Status: DC | PRN

## 2024-03-11 MED ORDER — HYDROXYZINE HCL 25 MG PO TABS: 25.0000 mg | ORAL_TABLET | Freq: Four times a day (QID) | ORAL | Status: DC | PRN

## 2024-03-11 MED ORDER — LOPERAMIDE HCL 2 MG PO CAPS: 2.0000 mg | ORAL_CAPSULE | ORAL | Status: DC | PRN

## 2024-03-11 MED ORDER — HALOPERIDOL LACTATE 5 MG/ML IJ SOLN: 10.0000 mg | Freq: Three times a day (TID) | INTRAMUSCULAR | Status: DC | PRN

## 2024-03-11 NOTE — Progress Notes (Signed)
 Meal given

## 2024-03-11 NOTE — ED Notes (Signed)
 Patient remains asleep in bed without issue or complaint.  Ciwa = 2.  Refused breakfast.  Makes needs known will monitor.

## 2024-03-11 NOTE — Group Note (Signed)
 Group Topic: Positive Affirmations  Group Date: 03/11/2024 Start Time: 1300 End Time: 1345 Facilitators: Elnor Keven SAILOR  Department: [facility]  Number of Participants: 7  Group Focus: affirmation Treatment Modality:  Psychoeducation Interventions utilized were physical activity, support Purpose: reinforce self-care  Name: Gina Ware Date of Birth: 29-Jun-1995  MR: [id number]    Level of Participation: moderate Quality of Participation: cooperative Interactions with others: gave feedback Mood/Affect: appropriate Triggers (if applicable): NA Cognition: coherent/clear Progress: Moderate Response: Pt shared her affirmation of choice was I deserve the best. Pt shared that she has goals for herself and often gets disappointed when she doesn't reach them, indicates that she will keep going to reach her goals.  Plan: follow-up needed  Patients Problems:  Patient Active Problem List   Diagnosis Date Noted   Alcohol  use disorder 03/11/2024   Alcohol  abuse 03/04/2024   Tonsillitis 03/04/2024   Anxiety and depression 04/04/2023   COVID-19 vaccination declined 04/04/2023   Need for influenza vaccination 04/04/2023   Acute pain of right shoulder 03/25/2022   Acute bilateral low back pain without sciatica 03/25/2022   Contraception management    Hyperthyroidism 09/28/2019

## 2024-03-11 NOTE — Group Note (Signed)
 Group Topic: Change and Accountability  Group Date: 03/11/2024 Start Time: 8069 End Time: 2000 Facilitators: Anice Benton LABOR, NT  Department: [facility]  Number of Participants: 4  Group Focus: acceptance and clarity of thought Treatment Modality:  Individual Therapy Interventions utilized were assignment Purpose: increase insight  Name: Gina Ware Date of Birth: 07/17/1995  MR: [id number]    Level of Participation: active Quality of Participation: attentive and appropriate Interactions with others: gave feedback Mood/Affect: Positive  Triggers (if applicable): N/A Cognition: coherent/clear and logical Progress: Moderate Response: Good Plan: follow-up needed  Patients Problems:  Patient Active Problem List   Diagnosis Date Noted   Alcohol  use disorder 03/11/2024   Alcohol  dependence, binge pattern (HCC) 03/11/2024   Cannabis use disorder, mild, in early remission 03/11/2024   Mild episode of recurrent major depressive disorder 03/11/2024   Generalized anxiety disorder 03/11/2024   Alcohol  abuse 03/04/2024   Tonsillitis 03/04/2024   Anxiety and depression 04/04/2023   COVID-19 vaccination declined 04/04/2023   Need for influenza vaccination 04/04/2023   Acute pain of right shoulder 03/25/2022   Acute bilateral low back pain without sciatica 03/25/2022   Contraception management    Hyperthyroidism 09/28/2019

## 2024-03-11 NOTE — ED Notes (Signed)
 Pt sleeping at present, no distress noted, monitoring for safety.

## 2024-03-11 NOTE — ED Notes (Signed)
 Pt sleeping at present, no distress noted.  Monitoring for safety.

## 2024-03-11 NOTE — ED Notes (Signed)
 Pt A&O x 4, presents requesting detox from alcohol , Pt reports she is a binge drinker.  Pt admits to drinking 5 glasses of Tequ[place], 3 days a week.  Denies SI, HI or AVH.  Monitoring for safety.

## 2024-03-11 NOTE — ED Provider Notes (Signed)
 Facility Based Crisis Admission H&P  Date: 03/11/24 Patient Name: Gina Ware MRN: [id number] Chief Complaint: I need to stop drinking.   Diagnoses:  Final diagnoses:  Alcohol  dependence, binge pattern (HCC)  Cannabis use disorder, mild, in early remission  Mild episode of recurrent major depressive disorder  Generalized anxiety disorder    HPI: This document summarizes an initial medical consultation with a 28 year old female patient regarding her anxiety, depression, and alcohol  use. It covers her presenting issues, history of alcohol  consumption, past medication use, and lifestyle factors such as sleep and diet. The summary also includes her medical and family history, details the unresolved treatment options and health risks, and concludes with a specific list of action items for the patient to address in her upcoming follow-up appointment.  Presenting Issues & Goals The patient identified anxiety, depression, and her relationship with alcohol  as the primary reasons for seeking help. This is her first time seeking medication for anxiety or depression. She previously used marijuana but stopped because she works for [employer] and cannot risk her job. Alcohol  Consumption The patient reported drinking daily until receiving her second DUI, after which she reduced her frequency to about three times a week. She describes her drinking pattern as binging, consuming four or five shots of tequila in a session. Her limit is five shots before she blacks out. She recently had an episode where she drank between Friday 10 p.m. and 2 a.m. and does not remember driving home. When drinking, her mood can vary from being the life of the party to aggressive if she feels provoked. Medication History and Discussion Naltrexone : The patient briefly took naltrexone  (Depade) two or three times this month but was under the mistaken impression it was for withdrawal symptoms. The doctor explained it works  by reducing the enjoyment of drinking. Anxiety & Depression Medications: The doctor mentioned several options for the patient to consider. Standard SSRIs like Prozac, Zoloft, Lexapro, and Paxil were noted. Older medications that also aid sleep, such as trimipramine and mirtazapine, were presented as alternatives. Sleep Aids: The patient has used Benadryl to help with sleep, but the doctor advised against this due to its anticholinergic side effects (e.g., dry mouth, constipation). Melatonin was suggested as a more natural option that works with the body's sleep cycle. Sleep and Lifestyle Sleep Patterns: The patient's sleep is inconsistent. She either gets a full night's rest or sleeps for only two or three hours, often going to sleep around 2 or 3 a.m. and waking at 7 a.m. for her job. Napping: She works from home and sometimes naps during breaks or between calls. She may also sleep from 4 p.m. to 8 p.m. after work, which disrupts her sleep cycle. Diet and Weight: The patient has gained a significant amount of weight, going from 104 pounds to 134 pounds in the last year, a 30% increase. She attributes the weight gain to eating more. Her diet consists of a lot of protein and starch. Personal Information: She is 27, lives alone, and works full-time for [employer]. She is also a Consulting civil engineer at [employer], majoring in Presenter, broadcasting. Her mother lives nearby and is caring for her dog. Medical & Family History Family History: Her mother has anxiety and depression. One brother has ADHD, and another has a form of autism that makes him very smart, particularly with math. Thyroid  Issues: A primary care doctor told her she might have hyperthyroidism.   A recent lab test from two months ago was abnormal,  but her most recent TSH level was normal, indicating she is on the edge of a thyroid  issue. The doctor mentioned the abnormal lab would be consistent with hyperthyroidism (overly  active). Self-Supplementation: The patient has taken iron and B12 supplements based on her own research, partly due to a family history of iron deficiency. The doctor explained that her labs do not show anemia and that B12 supplementation is likely unnecessary at her age unless a deficiency is proven. Allergies: She is allergic to grass clippings and pet dander.  PHQ 2-9:  Flowsheet Row ED from 03/10/2024 in [facility] Office Visit from 02/23/2024 in [facility] Office Visit from 03/24/2023 in [facility]  Thoughts that you would be better off dead, or of hurting yourself in some way Not at all Not at all Not at all  PHQ-9 Total Score 8 10 10     Flowsheet Row ED from 03/10/2024 in [facility] Most recent reading at 03/11/2024  9:08 AM ED from 03/10/2024 in [facility] Most recent reading at 03/10/2024  9:33 PM ED from 02/16/2024 in [facility] Most recent reading at 02/16/2024  9:14 PM  C-SSRS RISK CATEGORY No Risk No Risk No Risk    Screenings    Flowsheet Row Most Recent Value  CIWA-Ar Total 0    Total Time spent with patient: 30 minutes  Musculoskeletal  Strength & Muscle Tone: within normal limits Gait & Station: normal Patient leans: N/A  Psychiatric Specialty Exam  Presentation General Appearance:  Appropriate for Environment  Eye Contact: Good  Speech: Clear and Coherent  Speech Volume: Normal  Handedness: Right   Mood and Affect  Mood: Anxious  Affect: Appropriate   Thought Process  Thought Processes: Coherent  Descriptions of Associations:Intact  Orientation:Full (Time, Place and Person)  Thought Content:Logical  Diagnosis of Schizophrenia or Schizoaffective disorder in past: No   Hallucinations:Hallucinations: None  Ideas of Reference:None  Suicidal  Thoughts:Suicidal Thoughts: No  Homicidal Thoughts:Homicidal Thoughts: No   Sensorium  Memory: Immediate Good; Recent Fair; Remote Fair  Judgment: Fair  Insight: Shallow   Executive Functions  Concentration: Fair  Attention Span: Fair  Recall: [employer] of Knowledge: Fair  Language: Fair   Psychomotor Activity  Psychomotor Activity: Psychomotor Activity: Normal   Assets  Assets: Communication Skills; Desire for Improvement; Physical Health   Sleep  Sleep: Sleep: Poor   Nutritional Assessment (For OBS and FBC admissions only) Has the patient had a weight loss or gain of 10 pounds or more in the last 3 months?: No Has the patient had a decrease in food intake/or appetite?: No Does the patient have eating habits or behaviors that may be indicators of an eating disorder including binging or inducing vomiting?: No Has the patient recently lost weight without trying?: 0 Has the patient been eating poorly because of a decreased appetite?: 0 Malnutrition Screening Tool Score: 0    Physical Exam ROS  Blood pressure 135/81, pulse 70, temperature 98.3 F (36.8 C), temperature source Oral, resp. rate 18, last menstrual period 02/12/2024, SpO2 100%. There is no height or weight on file to calculate BMI.  Last Labs:  Admission on 03/10/2024, Discharged on 03/10/2024  Component Date Value Ref Range Status   WBC 03/10/2024 8.3  4.0 - 10.5 K/uL Final   RBC 03/10/2024 4.06  3.87 - 5.11 MIL/uL Final   Hemoglobin 03/10/2024 12.1  12.0 -  15.0 g/dL Final   HCT 90/72/7974 36.1  36.0 - 46.0 % Final   MCV 03/10/2024 88.9  80.0 - 100.0 fL Final   MCH 03/10/2024 29.8  26.0 - 34.0 pg Final   MCHC 03/10/2024 33.5  30.0 - 36.0 g/dL Final   RDW 90/72/7974 13.2  11.5 - 15.5 % Final   Platelets 03/10/2024 343  150 - 400 K/uL Final   nRBC 03/10/2024 0.0  0.0 - 0.2 % Final   Neutrophils Relative % 03/10/2024 42  % Final   Neutro Abs 03/10/2024 3.6  1.7 - 7.7 K/uL Final    Lymphocytes Relative 03/10/2024 41  % Final   Lymphs Abs 03/10/2024 3.4  0.7 - 4.0 K/uL Final   Monocytes Relative 03/10/2024 10  % Final   Monocytes Absolute 03/10/2024 0.8  0.1 - 1.0 K/uL Final   Eosinophils Relative 03/10/2024 6  % Final   Eosinophils Absolute 03/10/2024 0.5  0.0 - 0.5 K/uL Final   Basophils Relative 03/10/2024 1  % Final   Basophils Absolute 03/10/2024 0.1  0.0 - 0.1 K/uL Final   Immature Granulocytes 03/10/2024 0  % Final   Abs Immature Granulocytes 03/10/2024 0.02  0.00 - 0.07 K/uL Final   Performed at [facility], [address] [place], [place] [address]   Sodium 03/10/2024 137  135 - 145 mmol/L Final   Potassium 03/10/2024 3.8  3.5 - 5.1 mmol/L Final   Chloride 03/10/2024 106  98 - 111 mmol/L Final   CO2 03/10/2024 24  22 - 32 mmol/L Final   Glucose, Bld 03/10/2024 86  70 - 99 mg/dL Final   Glucose reference range applies only to samples taken after fasting for at least 8 hours.   BUN 03/10/2024 11  6 - 20 mg/dL Final   Creatinine, Ser 03/10/2024 0.75  0.44 - 1.00 mg/dL Final   Calcium 90/72/7974 9.0  8.9 - 10.3 mg/dL Final   Total Protein 90/72/7974 6.6  6.5 - 8.1 g/dL Final   Albumin 90/72/7974 3.9  3.5 - 5.0 g/dL Final   AST 90/72/7974 15  15 - 41 U/L Final   ALT 03/10/2024 10  0 - 44 U/L Final   Alkaline Phosphatase 03/10/2024 52  38 - 126 U/L Final   Total Bilirubin 03/10/2024 0.9  0.0 - 1.2 mg/dL Final   GFR, Estimated 03/10/2024 >60  >60 mL/min Final   Comment: (NOTE) Calculated using the CKD-EPI Creatinine Equation (2021)    Anion gap 03/10/2024 7  5 - 15 Final   Performed at [facility], [address], [place], [place] [address]   Hgb A1c MFr Bld 03/10/2024 4.5 (L)  4.8 - 5.6 % Final   Comment: (NOTE) Diagnosis of Diabetes The following HbA1c ranges recommended by the American Diabetes Association (ADA) may be used as an aid in the diagnosis of diabetes mellitus.  Hemoglobin             Suggested A1C NGSP%               Diagnosis  <5.7                   Non Diabetic  5.7-6.4                Pre-Diabetic  >6.4                   Diabetic  <7.0  Glycemic control for                       adults with diabetes.     Mean Plasma Glucose 03/10/2024 82.45  mg/dL Final   Performed at [facility], [address] [place], [place] [address]   Magnesium 03/10/2024 1.9  1.7 - 2.4 mg/dL Final   Performed at [facility], [address] [place], [place] [address]   Alcohol , Ethyl (B) 03/10/2024 <15  <15 mg/dL Final   Comment: (NOTE) For medical purposes only. Performed at [facility], [address], [place], [place] [address]    Cholesterol 03/10/2024 175  0 - 200 mg/dL Final   Triglycerides 90/72/7974 48  <150 mg/dL Final   HDL 90/72/7974 88  >40 mg/dL Final   Total CHOL/HDL Ratio 03/10/2024 2.0  RATIO Final   VLDL 03/10/2024 10  0 - 40 mg/dL Final   LDL Cholesterol 03/10/2024 77  0 - 99 mg/dL Final   Comment:        Total Cholesterol/HDL:CHD Risk Coronary Heart Disease Risk Table                     Men   Women  1/2 Average Risk   3.4   3.3  Average Risk       5.0   4.4  2 X Average Risk   9.6   7.1  3 X Average Risk  23.4   11.0        Use the calculated Patient Ratio above and the CHD Risk Table to determine the patient's CHD Risk.        ATP III CLASSIFICATION (LDL):  <100     mg/dL   Optimal  899-870  mg/dL   Near or Above                    Optimal  130-159  mg/dL   Borderline  839-810  mg/dL   High  >809     mg/dL   Very High Performed at [facility], [address] [place], [place] [address]    TSH 03/10/2024 0.415  0.350 - 4.500 uIU/mL Final   Comment: Performed by a 3rd Generation assay with a functional sensitivity of <=0.01 uIU/mL. Performed at [facility], [address], [place], [place] [address]    Preg Test, Ur 03/11/2024 Negative  Negative Final   POC Amphetamine UR 03/11/2024 None Detected  NONE DETECTED (Cut Off Level  1000 ng/mL) Final   POC Secobarbital (BAR) 03/11/2024 None Detected  NONE DETECTED (Cut Off Level 300 ng/mL) Final   POC Buprenorphine (BUP) 03/11/2024 None Detected  NONE DETECTED (Cut Off Level 10 ng/mL) Final   POC Oxazepam (BZO) 03/11/2024 None Detected  NONE DETECTED (Cut Off Level 300 ng/mL) Final   POC Cocaine UR 03/11/2024 None Detected  NONE DETECTED (Cut Off Level 300 ng/mL) Final   POC Methamphetamine UR 03/11/2024 None Detected  NONE DETECTED (Cut Off Level 1000 ng/mL) Final   POC Morphine 03/11/2024 None Detected  NONE DETECTED (Cut Off Level 300 ng/mL) Final   POC Methadone UR 03/11/2024 None Detected  NONE DETECTED (Cut Off Level 300 ng/mL) Final   POC Oxycodone UR 03/11/2024 None Detected  NONE DETECTED (Cut Off Level 100 ng/mL) Final   POC Marijuana UR 03/11/2024 Positive (A)  NONE DETECTED (Cut Off Level 50 ng/mL) Final  Office Visit on  02/23/2024  Component Date Value Ref Range Status   TSH 02/23/2024 0.282 (L)  0.450 - 4.500 uIU/mL Final   Free T4 02/23/2024 1.15  0.82 - 1.77 ng/dL Final    Allergies: Cat dander and Dog epithelium  Medications:  Facility Ordered Medications  Medication   acetaminophen (TYLENOL) tablet 650 mg   alum & mag hydroxide-simeth (MAALOX/MYLANTA) 200-200-20 MG/5ML suspension 30 mL   magnesium hydroxide (MILK OF MAGNESIA) suspension 30 mL   haloperidol (HALDOL) tablet 5 mg   And   diphenhydrAMINE (BENADRYL) capsule 50 mg   haloperidol lactate (HALDOL) injection 5 mg   And   diphenhydrAMINE (BENADRYL) injection 50 mg   And   LORazepam  (ATIVAN ) injection 2 mg   haloperidol lactate (HALDOL) injection 10 mg   And   diphenhydrAMINE (BENADRYL) injection 50 mg   And   LORazepam  (ATIVAN ) injection 2 mg   thiamine (VITAMIN B1) injection 100 mg   [START ON 03/12/2024] thiamine (VITAMIN B1) tablet 100 mg   multivitamin with minerals tablet 1 tablet   LORazepam  (ATIVAN ) tablet 1 mg   hydrOXYzine (ATARAX) tablet 25 mg   loperamide (IMODIUM)  capsule 2-4 mg   ondansetron  (ZOFRAN -ODT) disintegrating tablet 4 mg   PTA Medications  Medication Sig   naltrexone  (DEPADE) 50 MG tablet Take 1 tablet (50 mg total) by mouth daily. (Patient taking differently: Take 50 mg by mouth daily as needed.)   amoxicillin -clavulanate (AUGMENTIN ) 875-125 MG tablet Take 1 tablet by mouth 2 (two) times daily.   ondansetron  (ZOFRAN -ODT) 8 MG disintegrating tablet Take 8 mg by mouth daily as needed for nausea or vomiting.   loratadine (CLARITIN) 10 MG tablet Take 10 mg by mouth daily as needed for allergies.   cyanocobalamin (VITAMIN B12) 1000 MCG tablet Take 1,000 mcg by mouth daily as needed (nutritional supplement).   ferrous sulfate 325 (65 FE) MG tablet Take 325 mg by mouth daily with breakfast.   Prenatal Vit-Fe Fumarate-FA (MULTIVITAMIN-PRENATAL) 27-0.8 MG TABS tablet Take 1 tablet by mouth daily at 12 noon.   etonogestrel (NEXPLANON) 68 MG IMPL implant Inject 1 each into the skin once.    Long Term Goals: Improvement in symptoms so as ready for discharge  Short Term Goals: Patient will verbalize feelings in meetings with treatment team members., Patient will attend at least of 50% of the groups daily., Patient will participate in completing the [place] Suicide Severity Rating Scale, Patient will score a low risk of violence for 24 hours prior to discharge, and Patient will take medications as prescribed daily.  Medical Decision Making      Recommendations  Admit to [facility] for multimodal treatment.  Continue discussion of MDD/anxiety treatment.  Add-on labs for folate, B12, thyroid  confirmation of subclinical hyperthyroidism vs (possible exophthalmos in evolution).  Patient candidate for naltrexone  but not enthusiastic about blunting her enthusiasm for alcohol . Residential treatment most appropriate modality but anticipate patient bias towards CD-IOP. Patient needs extensive guidance on sleep hygiene. Circadian disturbance likely exacerbates mood,  anxiety, and behavior. Diet appears micronutrient deficient and macronutrient surplus. Patient needs dietary guidance.    KANDI JAYSON HAHN, MD 03/11/24  6:47 PM

## 2024-03-12 DIAGNOSIS — F330 Major depressive disorder, recurrent, mild: Secondary | ICD-10-CM | POA: Diagnosis not present

## 2024-03-12 DIAGNOSIS — F1020 Alcohol dependence, uncomplicated: Secondary | ICD-10-CM | POA: Diagnosis not present

## 2024-03-12 DIAGNOSIS — F1211 Cannabis abuse, in remission: Secondary | ICD-10-CM | POA: Diagnosis not present

## 2024-03-12 DIAGNOSIS — F411 Generalized anxiety disorder: Secondary | ICD-10-CM | POA: Diagnosis not present

## 2024-03-12 MED ORDER — SERTRALINE HCL 50 MG PO TABS
50.0000 mg | ORAL_TABLET | Freq: Once | ORAL | Status: AC
  Administered 2024-03-13: 50 mg via ORAL
  Filled 2024-03-12: qty 1

## 2024-03-12 NOTE — ED Notes (Signed)
 Patient alert & oriented x4. Denies intent to harm self or others when asked. Denies A/VH. Patient denies any physical complaints when asked. No acute distress noted. Writer verified contraceptive as chart had patient flagged. Scheduled medications administered with no complications. Support and encouragement provided. Patient observed in milieu. No inappropriate behaviors observed or reported. Routine safety checks conducted per facility protocol. Encouraged patient to notify staff if any thoughts of harm towards self or others arise. Patient verbalizes understanding and agreement.

## 2024-03-12 NOTE — ED Notes (Signed)
 Paitent had dinner.

## 2024-03-12 NOTE — Care Management (Addendum)
 [facility] Care Management  Writer met with the patient and discussed discharge planning.  Patient requests intensive outpatient substance abuse treatment in the evenings.  Patient is requesting to discharge Tuesday 03-14-2024 afternoon   Patient reports that she is employed at the [employer] and her employer needs to be contacted.    Patient is requesting an evening CD-IOP program due to her working in the day.    Patient has an upcoming DWI court date on 04-23-2024 and a second DWI court date that  has not been scheduled as of yet.  Patient received her 2nd DWI within the span of 6 months.  Patient reports drinking 3 times weekly.     Patient has requested that the writer contact her PCP Dr. Romero Ada on [address].  1:10pm  Writer left a HIPAA compliant voicemail message with her supervisor: Meghan Soya [phone]

## 2024-03-12 NOTE — ED Notes (Signed)
 Paitent had lunch.
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
## 2024-03-12 NOTE — Group Note (Signed)
 Group Topic: Fears and Unhealthy Coping Skills  Group Date: 03/12/2024 Start Time: 1930 End Time: 2000 Facilitators: Verdon Jacqualyn BRAVO, NT  Department: [facility]  Number of Participants: 11  Group Focus: diagnosis education Treatment Modality:  Individual Therapy Interventions utilized were group exercise Purpose: increase insight  Name: Gina Ware Date of Birth: Mar 21, 1996  MR: [id number]    Level of Participation: active Quality of Participation: engaged Interactions with others: gave feedback Mood/Affect: appropriate Triggers (if applicable): n/a Cognition: n/a Progress: Moderate Response: n/a Plan: follow-up needed  Patients Problems:  Patient Active Problem List   Diagnosis Date Noted   Alcohol  use disorder 03/11/2024   Alcohol  dependence, binge pattern (HCC) 03/11/2024   Cannabis use disorder, mild, in early remission 03/11/2024   Mild episode of recurrent major depressive disorder 03/11/2024   Generalized anxiety disorder 03/11/2024   Alcohol  abuse 03/04/2024   Tonsillitis 03/04/2024   Anxiety and depression 04/04/2023   COVID-19 vaccination declined 04/04/2023   Need for influenza vaccination 04/04/2023   Acute pain of right shoulder 03/25/2022   Acute bilateral low back pain without sciatica 03/25/2022   Contraception management    Hyperthyroidism 09/28/2019

## 2024-03-12 NOTE — Group Note (Signed)
 Group Topic: Fears and Unhealthy Coping Skills  Group Date: 03/12/2024 Start Time: 0900 End Time: 0950 Facilitators: Lonzell Dwayne RAMAN, NT  Department: [facility]  Number of Participants: 7  Group Focus: chemical dependency issues Treatment Modality:  Patient-Centered Therapy Interventions utilized were group exercise Purpose: reinforce self-care  Name: Gina Ware Date of Birth: [birth year]  MR: [id number]    Level of Participation: Patient did attend groupactive Quality of Participation: attentive Interactions with others: gave feedback Mood/Affect: positive Triggers (if applicable): N/A Cognition: coherent/clear Progress: Gaining insight Response: appropriate  Plan: follow-up needed  Patients Problems:  Patient Active Problem List   Diagnosis Date Noted   Alcohol  use disorder 03/11/2024   Alcohol  dependence, binge pattern (HCC) 03/11/2024   Cannabis use disorder, mild, in early remission 03/11/2024   Mild episode of recurrent major depressive disorder 03/11/2024   Generalized anxiety disorder 03/11/2024   Alcohol  abuse 03/04/2024   Tonsillitis 03/04/2024   Anxiety and depression 04/04/2023   COVID-19 vaccination declined 04/04/2023   Need for influenza vaccination 04/04/2023   Acute pain of right shoulder 03/25/2022   Acute bilateral low back pain without sciatica 03/25/2022   Contraception management    Hyperthyroidism 09/28/2019

## 2024-03-12 NOTE — ED Notes (Signed)
 Patient is in the bedroom calm and sleeping.NAD. Respirations even and unlabored. Will continue to monitor for safety.

## 2024-03-12 NOTE — Group Note (Signed)
 Group Topic: Understanding Self  Group Date: 03/12/2024 Start Time: 1210 End Time: 1230 Facilitators: Daved Tinnie HERO, RN  Department: [facility]  Number of Participants: 9  Group Focus: nursing group Treatment Modality:  Psychoeducation Interventions utilized were exploration Purpose: increase insight  Name: Gina Ware Date of Birth: 06-Feb-1996  MR: [id number]    Level of Participation: active Quality of Participation: attentive and cooperative Interactions with others: gave feedback Mood/Affect: flat Triggers (if applicable): n/a Cognition: coherent/clear Progress: Gaining insight Response: pt explored maslows hierarchy of needs, says they need to improve respect and meeting full potential Plan: patient will be encouraged to attend future RN education groups  Patients Problems:  Patient Active Problem List   Diagnosis Date Noted   Alcohol  use disorder 03/11/2024   Alcohol  dependence, binge pattern (HCC) 03/11/2024   Cannabis use disorder, mild, in early remission 03/11/2024   Mild episode of recurrent major depressive disorder 03/11/2024   Generalized anxiety disorder 03/11/2024   Alcohol  abuse 03/04/2024   Tonsillitis 03/04/2024   Anxiety and depression 04/04/2023   COVID-19 vaccination declined 04/04/2023   Need for influenza vaccination 04/04/2023   Acute pain of right shoulder 03/25/2022   Acute bilateral low back pain without sciatica 03/25/2022   Contraception management    Hyperthyroidism 09/28/2019

## 2024-03-13 ENCOUNTER — Telehealth: Payer: Self-pay

## 2024-03-13 MED ORDER — AMOXICILLIN-POT CLAVULANATE 875-125 MG PO TABS: 1.0000 | ORAL_TABLET | Freq: Two times a day (BID) | ORAL | 0 refills | Status: AC

## 2024-03-13 MED ORDER — SERTRALINE HCL 50 MG PO TABS: 50.0000 mg | ORAL_TABLET | Freq: Every day | ORAL | 0 refills | Status: AC

## 2024-03-13 MED ORDER — TRAZODONE HCL 50 MG PO TABS: 50.0000 mg | ORAL_TABLET | Freq: Every evening | ORAL | 1 refills | Status: AC | PRN

## 2024-03-13 NOTE — ED Notes (Signed)
 Patient A&Ox4. Denies intent to harm self/others when asked. Denies A/VH. Patient denies any physical complaints when asked. No acute distress noted. Support and encouragement provided. Routine safety checks conducted according to facility protocol. Encouraged patient to notify staff if thoughts of harm toward self or others arise. Patient verbalize understanding and agreement. Will continue to monitor for safety.

## 2024-03-13 NOTE — Discharge Instructions (Addendum)
 She is going to be going to the employee assistance program. Patient reports that she will follow up with her EAP counseling with her current employer [employer]).  Patient declined inpatient substance abuse treatment and intensive outpatient treatment.  Patient reports that she lives alone and she is able to drive herself home.

## 2024-03-13 NOTE — Telephone Encounter (Signed)
 LVM for patients supervisor Meghan Soso [phone] to get fax number for FMLA.

## 2024-03-13 NOTE — Care Management (Signed)
 [facility] Care Management  Writer met with the patient and discussed discharge planning. Patient requests to discharge today.    Patient reports that she will follow up with her EAP counseling with her current employer [facility] [facility] [employer]).  Patient declined inpatient substance abuse treatment and intensive outpatient treatment.    Patient reports that she lives alone and she is able to drive herself home.

## 2024-03-13 NOTE — Telephone Encounter (Signed)
 LVM #3 FOR MEGHAN.

## 2024-03-13 NOTE — ED Notes (Signed)
 Patient asleep at this time. NAD. Will continue to monitor for safety.

## 2024-03-13 NOTE — Telephone Encounter (Signed)
 Copied from CRM [id number]. Topic: General - Other >> Mar 13, 2024  5:17 PM Lauren C wrote: Reason for CRM: Gina Ware returning call from [place] regarding FMLA ppwk. She says the correct fax# is [phone]. If any issues, reach out to her at [phone]

## 2024-03-13 NOTE — ED Provider Notes (Signed)
 Patient slept better last night. Will give Zoloft and Trazodone continue out patient antibiotic. She has no SI/HI/AH/VH. She is going to be doing EAP program and continuing with her therapist She will go to AA. Gave letters for woke and school and her lawyers regarding her DWI. Sent scripts for Trazodone and Zoloft. Discharge today.

## 2024-03-13 NOTE — ED Notes (Signed)
 Paitent had lunch.

## 2024-03-13 NOTE — ED Notes (Signed)
 Pt sleeping in no acute distress. RR even and unlabored. Environment secured. Will continue to monitor for safety.

## 2024-03-13 NOTE — ED Notes (Signed)
 Patient A&O x 4, ambulatory. Patient discharged in no acute distress. Patient denied SI/HI, A/VH upon discharge. Patient verbalized understanding of all discharge instructions reviewed on AVS via staff, to include follow up appointments, RX's and safety. Suicide safety plan completed and reviewed with Clinical research associate. A copy given to pt. Patient reported mood 10/10.  Pt belongings returned to patient from locker #17 intact. Patient escorted to lobby via staff for transport to destination. Safety maintained.

## 2024-03-13 NOTE — Telephone Encounter (Signed)
 Called [facility] to get the name of patients job so we can call and get fax number for FMLA. (Fax number not on paperwork). Person at [facility] stated since I didn't have access code for patient they were not able to confirm or deny if patient was there or give me any information.

## 2024-03-13 NOTE — ED Notes (Signed)
 Paitent had breakfast.

## 2024-03-13 NOTE — Telephone Encounter (Signed)
 LVM #2 FOR MEGHAN.

## 2024-03-13 NOTE — Telephone Encounter (Signed)
 Copied from CRM [id number]. Topic: Medical Record Request - Other >> Mar 12, 2024  3:25 PM Berwyn MATSU wrote: Reason for CRM: Denece Collet from [facility] coordinator facilities crisis unit called in to advise that patient was admitted.  Patient would also like to start her FMLA paperwork for her.  May you please.

## 2024-03-19 ENCOUNTER — Telehealth: Payer: Self-pay

## 2024-03-19 NOTE — Telephone Encounter (Signed)
 Left message for Gina Ware for different fax number or email- fax number given shows busy.

## 2024-03-20 ENCOUNTER — Other Ambulatory Visit

## 2024-07-13 ENCOUNTER — Ambulatory Visit: Admitting: Radiology

## 2024-07-13 NOTE — Progress Notes (Unsigned)
" ° °  Gina Ware 08-26-1995 [id number]   History:  29 y.o. G0 presents for annual exam as a new patient.   Gynecologic History No LMP recorded. Patient has had an implant.   Contraception/Family planning: Nexplanon Sexually active: yes Last Pap: 2021. Results were: normal per pt   Obstetric History OB History  Gravida Para Term Preterm AB Living  0 0 0 0 0 0  SAB IAB Ectopic Multiple Live Births  0 0 0 0 0      03/12/2024    8:38 PM 03/10/2024   10:07 PM 02/23/2024    4:40 PM  Depression screen PHQ 2/9  Decreased Interest   1  Down, Depressed, Hopeless   2  PHQ - 2 Score   3  Altered sleeping   2  Tired, decreased energy   1  Change in appetite   3  Feeling bad or failure about yourself    0  Trouble concentrating   0  Moving slowly or fidgety/restless   1  Suicidal thoughts   0  PHQ-9 Score   10   Difficult doing work/chores   Somewhat difficult     Information is confidential and restricted. Go to Review Flowsheets to unlock data.   Data saved with a previous flowsheet row definition     The following portions of the patient's history were reviewed and updated as appropriate: allergies, current medications, past family history, past medical history, past social history, past surgical history, and problem list.  Review of Systems Pertinent items noted in HPI and remainder of comprehensive ROS otherwise negative.   Past medical history, past surgical history, family history and social history were all reviewed and documented in the EPIC chart.   Exam:  There were no vitals filed for this visit.  There is no height or weight on file to calculate BMI.  General appearance:  Normal Thyroid :  Symmetrical, normal in size, without palpable masses or nodularity. Respiratory  Auscultation:  Clear without wheezing or rhonchi Cardiovascular  Auscultation:  Regular rate, without rubs, murmurs or gallops  Edema/varicosities:  Not grossly  evident Abdominal  Soft,nontender, without masses, guarding or rebound.  Liver/spleen:  No organomegaly noted  Hernia:  None appreciated  Skin  Inspection:  Grossly normal Breasts: Examined lying and sitting.   Right: Without masses, retractions, nipple discharge or axillary adenopathy.   Left: Without masses, retractions, nipple discharge or axillary adenopathy. Genitourinary   Inguinal/mons:  Normal without inguinal adenopathy  External genitalia:  Normal appearing vulva with no masses, tenderness, or lesions  BUS/Urethra/Skene's glands:  Normal without masses or exudate  Vagina:  Normal appearing with normal color and discharge, no lesions  Cervix:  Normal appearing without discharge or lesions  Uterus:  Normal in size, shape and contour.  Mobile, nontender  Adnexa/parametria:     Rt: Normal in size, without masses or tenderness.   Lt: Normal in size, without masses or tenderness.  Anus and perineum: Normal   Patient informed chaperone available to be present for breast and pelvic exam. Patient has requested no chaperone to be present. Patient has been advised what will be completed during breast and pelvic exam.   Assessment/Plan:     Return in 1 year for annual or as needed.   GINETTE COZIER B WHNP-BC 2:57 PM 07/13/2024  "

## 2024-08-22 ENCOUNTER — Ambulatory Visit: Payer: Self-pay | Admitting: Nurse Practitioner
